# Patient Record
Sex: Female | Born: 1965 | Race: Black or African American | Hispanic: No | Marital: Married | Smoking: Never smoker
Health system: Southern US, Community
[De-identification: ages and names within clinical notes are randomized; demographics above are authoritative.]

## PROBLEM LIST (undated history)

## (undated) DIAGNOSIS — IMO0002 Reserved for concepts with insufficient information to code with codable children: Secondary | ICD-10-CM

## (undated) DIAGNOSIS — N840 Polyp of corpus uteri: Secondary | ICD-10-CM

## (undated) DIAGNOSIS — I10 Essential (primary) hypertension: Secondary | ICD-10-CM

## (undated) DIAGNOSIS — N83201 Unspecified ovarian cyst, right side: Secondary | ICD-10-CM

## (undated) DIAGNOSIS — N72 Inflammatory disease of cervix uteri: Secondary | ICD-10-CM

## (undated) HISTORY — DX: Inflammatory disease of cervix uteri: N72

## (undated) HISTORY — DX: Polyp of corpus uteri: N84.0

## (undated) HISTORY — DX: Reserved for concepts with insufficient information to code with codable children: IMO0002

## (undated) HISTORY — DX: Essential (primary) hypertension: I10

## (undated) HISTORY — PX: HERNIA REPAIR: SHX51

## (undated) HISTORY — DX: Unspecified ovarian cyst, right side: N83.201

---

## 1998-04-09 ENCOUNTER — Ambulatory Visit (HOSPITAL_COMMUNITY): Admission: RE | Admit: 1998-04-09 | Discharge: 1998-04-09 | Payer: Self-pay | Admitting: Obstetrics & Gynecology

## 1998-04-14 ENCOUNTER — Encounter (HOSPITAL_COMMUNITY): Admission: RE | Admit: 1998-04-14 | Discharge: 1998-05-13 | Payer: Self-pay | Admitting: *Deleted

## 1998-05-10 ENCOUNTER — Inpatient Hospital Stay (HOSPITAL_COMMUNITY): Admission: AD | Admit: 1998-05-10 | Discharge: 1998-05-10 | Payer: Self-pay | Admitting: Obstetrics

## 1998-05-12 ENCOUNTER — Inpatient Hospital Stay (HOSPITAL_COMMUNITY): Admission: AD | Admit: 1998-05-12 | Discharge: 1998-05-16 | Payer: Self-pay | Admitting: *Deleted

## 1998-05-15 ENCOUNTER — Encounter (HOSPITAL_COMMUNITY): Admission: RE | Admit: 1998-05-15 | Discharge: 1998-07-01 | Payer: Self-pay | Admitting: *Deleted

## 1999-04-20 ENCOUNTER — Ambulatory Visit (HOSPITAL_COMMUNITY): Admission: RE | Admit: 1999-04-20 | Discharge: 1999-04-20 | Payer: Self-pay | Admitting: *Deleted

## 1999-11-02 ENCOUNTER — Inpatient Hospital Stay (HOSPITAL_COMMUNITY): Admission: AD | Admit: 1999-11-02 | Discharge: 1999-11-07 | Payer: Self-pay | Admitting: *Deleted

## 2000-02-07 ENCOUNTER — Other Ambulatory Visit: Admission: RE | Admit: 2000-02-07 | Discharge: 2000-02-07 | Payer: Self-pay | Admitting: Obstetrics and Gynecology

## 2001-02-13 ENCOUNTER — Other Ambulatory Visit: Admission: RE | Admit: 2001-02-13 | Discharge: 2001-02-13 | Payer: Self-pay | Admitting: Obstetrics and Gynecology

## 2002-02-14 ENCOUNTER — Other Ambulatory Visit: Admission: RE | Admit: 2002-02-14 | Discharge: 2002-02-14 | Payer: Self-pay | Admitting: Obstetrics and Gynecology

## 2003-02-24 ENCOUNTER — Other Ambulatory Visit: Admission: RE | Admit: 2003-02-24 | Discharge: 2003-02-24 | Payer: Self-pay | Admitting: Obstetrics and Gynecology

## 2003-08-19 ENCOUNTER — Other Ambulatory Visit: Admission: RE | Admit: 2003-08-19 | Discharge: 2003-08-19 | Payer: Self-pay | Admitting: Obstetrics and Gynecology

## 2003-08-19 DIAGNOSIS — R87619 Unspecified abnormal cytological findings in specimens from cervix uteri: Secondary | ICD-10-CM | POA: Insufficient documentation

## 2003-10-13 ENCOUNTER — Other Ambulatory Visit: Admission: RE | Admit: 2003-10-13 | Discharge: 2003-10-13 | Payer: Self-pay | Admitting: Obstetrics and Gynecology

## 2003-10-13 HISTORY — PX: COLPOSCOPY: SHX161

## 2004-05-06 ENCOUNTER — Other Ambulatory Visit: Admission: RE | Admit: 2004-05-06 | Discharge: 2004-05-06 | Payer: Self-pay | Admitting: Obstetrics and Gynecology

## 2004-07-13 ENCOUNTER — Ambulatory Visit (HOSPITAL_COMMUNITY): Admission: RE | Admit: 2004-07-13 | Discharge: 2004-07-13 | Payer: Self-pay | Admitting: Obstetrics and Gynecology

## 2004-07-16 ENCOUNTER — Ambulatory Visit (HOSPITAL_COMMUNITY): Admission: RE | Admit: 2004-07-16 | Discharge: 2004-07-16 | Payer: Self-pay | Admitting: Obstetrics and Gynecology

## 2005-04-16 ENCOUNTER — Ambulatory Visit (HOSPITAL_COMMUNITY): Admission: RE | Admit: 2005-04-16 | Discharge: 2005-04-16 | Payer: Self-pay | Admitting: Obstetrics and Gynecology

## 2005-07-19 ENCOUNTER — Other Ambulatory Visit: Admission: RE | Admit: 2005-07-19 | Discharge: 2005-07-19 | Payer: Self-pay | Admitting: Obstetrics and Gynecology

## 2006-02-09 ENCOUNTER — Inpatient Hospital Stay (HOSPITAL_COMMUNITY): Admission: AD | Admit: 2006-02-09 | Discharge: 2006-02-12 | Payer: Self-pay | Admitting: Obstetrics and Gynecology

## 2006-02-19 ENCOUNTER — Inpatient Hospital Stay (HOSPITAL_COMMUNITY): Admission: AD | Admit: 2006-02-19 | Discharge: 2006-02-19 | Payer: Self-pay | Admitting: Obstetrics and Gynecology

## 2012-06-14 ENCOUNTER — Other Ambulatory Visit: Payer: Self-pay | Admitting: Family Medicine

## 2012-06-14 ENCOUNTER — Ambulatory Visit
Admission: RE | Admit: 2012-06-14 | Discharge: 2012-06-14 | Disposition: A | Discharge status: Home / Self Care | Payer: Commercial Managed Care - PPO | Source: Ambulatory Visit | Attending: Family Medicine | Admitting: Family Medicine

## 2012-06-14 DIAGNOSIS — M419 Scoliosis, unspecified: Secondary | ICD-10-CM

## 2012-07-17 ENCOUNTER — Other Ambulatory Visit: Payer: Self-pay | Admitting: Family Medicine

## 2012-07-17 ENCOUNTER — Ambulatory Visit
Admission: RE | Admit: 2012-07-17 | Discharge: 2012-07-17 | Disposition: A | Discharge status: Home / Self Care | Payer: Commercial Managed Care - PPO | Source: Ambulatory Visit | Attending: Family Medicine | Admitting: Family Medicine

## 2012-07-17 DIAGNOSIS — M439 Deforming dorsopathy, unspecified: Secondary | ICD-10-CM

## 2012-07-18 ENCOUNTER — Ambulatory Visit: Payer: Self-pay | Admitting: Obstetrics and Gynecology

## 2012-08-28 ENCOUNTER — Ambulatory Visit: Payer: Self-pay | Admitting: Obstetrics and Gynecology

## 2012-09-06 ENCOUNTER — Encounter: Payer: Self-pay | Admitting: Obstetrics and Gynecology

## 2012-09-06 ENCOUNTER — Ambulatory Visit: Payer: Commercial Managed Care - PPO | Admitting: Obstetrics and Gynecology

## 2012-09-06 VITALS — BP 110/72 | HR 72 | Ht 65.0 in | Wt 152.0 lb

## 2012-09-06 DIAGNOSIS — Z124 Encounter for screening for malignant neoplasm of cervix: Secondary | ICD-10-CM

## 2012-09-06 NOTE — Progress Notes (Signed)
Last Pap: 05/13/10 WNL: Yes Regular Periods:no Contraception: none  Monthly Breast exam:yes Tetanus<65yrs:yes Nl.Bladder Function:yes Daily BMs:yes Healthy Diet:yes Calcium:no Mammogram:no Date of Mammogram: n/a Exercise:no Have often Exercise: n/a Seatbelt: yes Abuse at home: no Stressful work:no  Sigmoid-colonoscopy: n/a Bone Density: No PCP: Dr. Loleta Chance Change in PMH: no changes Change in [facility]: no changes BP 110/72  Pulse 72  Ht 5\' 5"  (1.651 m)  Wt 152 lb (68.947 kg)  BMI 25.29 kg/m2  LMP 08/27/2012 Pt with complaints:yes and pt has a right inguinal hernia that is bothering her.   Physical Examination: General appearance - alert, well appearing, and in no distress Mental status - normal mood, behavior, speech, dress, motor activity, and thought processes Neck - supple, no significant adenopathy,  thyroid exam: thyroid is normal in size without nodules or tenderness Chest - clear to auscultation, no wheezes, rales or rhonchi, symmetric air entry Heart - normal rate and regular rhythm Abdomen - soft, nontender, nondistended, no masses or organomegaly Breasts - breasts appear normal, no suspicious masses, no skin or nipple changes or axillary nodes Pelvic - normal external genitalia, vulva, vagina, cervix, uterus and adnexa Rectal - rectal exam not indicated Back exam - full range of motion, no tenderness, palpable spasm or pain on motion Neurological - alert, oriented, normal speech, no focal findings or movement disorder noted Musculoskeletal - no joint tenderness, deformity or swelling Extremities - no edema, redness or tenderness in the calves or thighs Skin - normal coloration and turgor, no rashes, no suspicious skin lesions noted Routine exam Pap sent yes Mammogram due yes pt declined nothing used for contraception RT 1 month for [facility] pt with h/o endometrial polyp and desires OBS Refer to general surgery for inguinal hernia

## 2012-09-18 ENCOUNTER — Encounter (INDEPENDENT_AMBULATORY_CARE_PROVIDER_SITE_OTHER): Payer: Self-pay | Admitting: General Surgery

## 2012-09-18 ENCOUNTER — Ambulatory Visit (INDEPENDENT_AMBULATORY_CARE_PROVIDER_SITE_OTHER): Payer: Commercial Managed Care - PPO | Admitting: General Surgery

## 2012-09-18 VITALS — BP 118/76 | HR 80 | Temp 97.4°F | Resp 16 | Ht 66.0 in | Wt 150.6 lb

## 2012-09-18 DIAGNOSIS — R1031 Right lower quadrant pain: Secondary | ICD-10-CM

## 2012-09-18 NOTE — Patient Instructions (Signed)
Call when you notice it bulging

## 2012-09-18 NOTE — Progress Notes (Signed)
Subjective:     Patient ID: [name], female   DOB: [birth year], 47 y.o.   MRN: [id number]  HPI We are asked to see the patient in consultation by Dr. Normand Sloop to evaluate her for a right inguinal hernia. The patient is a 47 year old black female who has noticed for several years some intermittent swelling in the right groin. The swelling will come and go. Occasionally she will have some discomfort associated with the swelling. She denies any nausea or vomiting. Her bowels move regularly.  Review of Systems  Constitutional: Negative.   HENT: Negative.   Eyes: Negative.   Respiratory: Negative.   Cardiovascular: Negative.   Gastrointestinal: Negative.   Genitourinary: Negative.   Musculoskeletal: Negative.   Skin: Negative.   Neurological: Negative.   Hematological: Negative.   Psychiatric/Behavioral: Negative.        Objective:   Physical Exam  Constitutional: She is oriented to person, place, and time. She appears well-developed and well-nourished.  HENT:  Head: Normocephalic and atraumatic.  Eyes: Conjunctivae normal and EOM are normal. Pupils are equal, round, and reactive to light.  Neck: Normal range of motion. Neck supple.  Cardiovascular: Normal rate, regular rhythm and normal heart sounds.   Pulmonary/Chest: Effort normal and breath sounds normal.  Abdominal: Soft. Bowel sounds are normal.  Genitourinary:       There is no palpable fullness or impulse with straining in either groin today  Musculoskeletal: Normal range of motion.  Neurological: She is alert and oriented to person, place, and time.  Skin: Skin is warm and dry.  Psychiatric: She has a normal mood and affect. Her behavior is normal.       Assessment:     The patient has some intermittent right groin discomfort. This certainly could be from a small inguinal hernia that I just can't appreciate today on physical exam. Because we cannot appreciated on physical exam I have recommended either calling  us when the hernia is out so that we can see it or we could try to get a CT of her pelvis to look for radiographic evidence of a hernia.    Plan:     She has elected to call us when the hernia is out so that we can reexamine her. We will await to hear from her

## 2012-10-08 ENCOUNTER — Other Ambulatory Visit: Payer: Self-pay

## 2012-10-08 DIAGNOSIS — N840 Polyp of corpus uteri: Secondary | ICD-10-CM

## 2012-10-11 ENCOUNTER — Ambulatory Visit: Payer: Commercial Managed Care - PPO

## 2012-10-11 ENCOUNTER — Ambulatory Visit: Payer: Commercial Managed Care - PPO | Admitting: Obstetrics and Gynecology

## 2012-10-11 ENCOUNTER — Other Ambulatory Visit: Payer: Self-pay | Admitting: Obstetrics and Gynecology

## 2012-10-11 DIAGNOSIS — N840 Polyp of corpus uteri: Secondary | ICD-10-CM

## 2012-10-11 NOTE — Progress Notes (Signed)
Pt with h/o endometrial polyp SHG done per protocol Normal [place] No polyps seen  Rt 1 yr for AEX Pt denies any abnormal VB

## 2013-12-02 ENCOUNTER — Encounter (INDEPENDENT_AMBULATORY_CARE_PROVIDER_SITE_OTHER): Payer: Commercial Managed Care - PPO | Admitting: General Surgery

## 2013-12-24 ENCOUNTER — Encounter (INDEPENDENT_AMBULATORY_CARE_PROVIDER_SITE_OTHER): Payer: Self-pay | Admitting: General Surgery

## 2013-12-24 ENCOUNTER — Telehealth (INDEPENDENT_AMBULATORY_CARE_PROVIDER_SITE_OTHER): Payer: Self-pay

## 2013-12-24 ENCOUNTER — Ambulatory Visit (INDEPENDENT_AMBULATORY_CARE_PROVIDER_SITE_OTHER): Payer: Commercial Managed Care - PPO | Admitting: General Surgery

## 2013-12-24 VITALS — BP 130/76 | HR 100 | Temp 97.1°F | Ht 66.0 in | Wt 156.0 lb

## 2013-12-24 DIAGNOSIS — R1031 Right lower quadrant pain: Secondary | ICD-10-CM

## 2013-12-24 NOTE — Progress Notes (Signed)
Patient ID: [name], female   DOB: [birth year], 48 y.o.   MRN: [id number]  Chief Complaint  Patient presents with  . Routine Post Op    HPI Angelica HalimFrancisca C Ryer is a 48 y.o. female.  The patient is a 48 year old black female resolve over a year ago with right groin pain. She continues to have intermittent right groin pain. She denies any nausea or vomiting with it. Her appetite has been good and her bowels are working normally. She experiences the pain approximately every 2 weeks and it comes and goes very quickly. She does notice some swelling in her right groin at times.  HPI  Past Medical History  Diagnosis Date  . Hypertension   . Endometrial polyp   . Ovarian cyst, right   . Cervicitis   . Abnormal pap     Past Surgical History  Procedure Laterality Date  . Cesarean section  05/12/1998  . Cesarean section  11/04/1999  . Cesarean section  02/09/2006  . Colposcopy  10/13/03    Family History  Problem Relation Age of Onset  . Congestive Heart Failure Mother   . Breast cancer Sister   . Colon cancer Paternal Aunt     Social History History  Substance Use Topics  . Smoking status: Never Smoker   . Smokeless tobacco: Never Used  . Alcohol Use: No    No Known Allergies  Current Outpatient Prescriptions  Medication Sig Dispense Refill  . losartan (COZAAR) 25 MG tablet Take 25 mg by mouth daily.       No current facility-administered medications for this visit.    Review of Systems Review of Systems  Constitutional: Negative.   HENT: Negative.   Eyes: Negative.   Respiratory: Negative.   Cardiovascular: Negative.   Gastrointestinal: Negative.   Endocrine: Negative.   Genitourinary: Negative.   Musculoskeletal: Negative.   Skin: Negative.   Allergic/Immunologic: Negative.   Neurological: Negative.   Hematological: Negative.   Psychiatric/Behavioral: Negative.     Blood pressure 130/76, pulse 100, temperature 97.1 F (36.2 C), height 5\' 6"   (1.676 m), weight 156 lb (70.761 kg).  Physical Exam Physical Exam  Constitutional: She is oriented to person, place, and time. She appears well-developed and well-nourished.  HENT:  Head: Normocephalic and atraumatic.  Eyes: Conjunctivae and EOM are normal. Pupils are equal, round, and reactive to light.  Neck: Normal range of motion. Neck supple.  Cardiovascular: Normal rate, regular rhythm and normal heart sounds.   Pulmonary/Chest: Effort normal and breath sounds normal.  Abdominal: Soft. Bowel sounds are normal.  There does appear to be some fatty fullness that is palpable in the right groin but it does not reduce like a typical hernia. It is very soft. There is no abdominal pain.  Musculoskeletal: Normal range of motion.  Lymphadenopathy:    She has no cervical adenopathy.  Neurological: She is alert and oriented to person, place, and time.  Skin: Skin is warm and dry.  Psychiatric: She has a normal mood and affect. Her behavior is normal.    Data Reviewed As above  Assessment    The patient is having some intermittent pain and swelling in the right groin. Clinically I cannot appreciate a inguinal or ventral hernia. Because of this I think it would be reasonable to get a CT scan of the pelvis to look for radiographic evidence of a hernia.     Plan    Plan for CT of the pelvis. I will call with  the results of the study and then we can proceed accordingly.        Caleen Essexaul S Toth III 12/24/2013, 10:15 AM

## 2013-12-24 NOTE — Patient Instructions (Signed)
Will call with results of CT 

## 2013-12-24 NOTE — Telephone Encounter (Signed)
Pt was seen in the office today with Dr Carolynne Edouardoth. Dr Carolynne Edouardoth ordered a CT scan and pt was calling requesting the contrast. [facility] and they informed her that they were unable to receive the contrast from them because our office had given this to her. She states that we had not given this to her at her visit today. Informed her that she would be able to come and pick this up at our office. Pt was unsatisfied that she had to return to our office and then she hung up abruptly.

## 2013-12-26 ENCOUNTER — Other Ambulatory Visit: Payer: Commercial Managed Care - PPO

## 2014-01-01 ENCOUNTER — Ambulatory Visit
Admission: RE | Admit: 2014-01-01 | Discharge: 2014-01-01 | Disposition: A | Discharge status: Home / Self Care | Payer: Commercial Managed Care - PPO | Source: Ambulatory Visit | Attending: General Surgery | Admitting: General Surgery

## 2014-01-01 DIAGNOSIS — R1031 Right lower quadrant pain: Secondary | ICD-10-CM

## 2014-01-15 ENCOUNTER — Telehealth (INDEPENDENT_AMBULATORY_CARE_PROVIDER_SITE_OTHER): Payer: Self-pay

## 2014-01-15 NOTE — Telephone Encounter (Signed)
Message copied by Brennan Bailey on Wed Jan 15, 2014  1:34 PM ------      Message from: Caleen Essex III      Created: Tue Jan 07, 2014 11:21 AM       She has some sort of cyst in groin. They do not think it is a hernia ------

## 2014-01-15 NOTE — Telephone Encounter (Signed)
LMOM> Ct show a groin cyst, not a hernia. Needs appt to discuss possible cyst excision if she would like it removed.

## 2014-01-28 ENCOUNTER — Encounter (INDEPENDENT_AMBULATORY_CARE_PROVIDER_SITE_OTHER): Payer: Self-pay | Admitting: General Surgery

## 2014-01-28 ENCOUNTER — Ambulatory Visit (INDEPENDENT_AMBULATORY_CARE_PROVIDER_SITE_OTHER): Payer: Commercial Managed Care - PPO | Admitting: General Surgery

## 2014-01-28 VITALS — BP 122/76 | HR 88 | Ht 66.0 in | Wt 155.0 lb

## 2014-01-28 DIAGNOSIS — R1031 Right lower quadrant pain: Secondary | ICD-10-CM

## 2014-01-28 NOTE — Patient Instructions (Signed)
Call if you want to have cyst removed

## 2014-01-28 NOTE — Progress Notes (Signed)
Subjective:     Patient ID: [name], female   DOB: 1966/02/10, 48 y.o.   MRN: [id number]  HPI The patient is a 48 year old black female whom we have seen in the past with some intermittent right groin pain. Since her last visit we had her undergo a CT scan of her pelvis which showed a cyst in the right groin but the radiologist did not definitely think it was a hernia. Since the scan and her swelling has gone down and she denies any discomfort in her right groin.  Review of Systems  Constitutional: Negative.   HENT: Negative.   Eyes: Negative.   Respiratory: Negative.   Cardiovascular: Negative.   Gastrointestinal: Negative.   Endocrine: Negative.   Genitourinary: Negative.   Musculoskeletal: Negative.   Skin: Negative.   Allergic/Immunologic: Negative.   Neurological: Negative.   Hematological: Negative.   Psychiatric/Behavioral: Negative.        Objective:   Physical Exam  Constitutional: She is oriented to person, place, and time. She appears well-developed and well-nourished.  HENT:  Head: Normocephalic and atraumatic.  Eyes: Conjunctivae and EOM are normal. Pupils are equal, round, and reactive to light.  Neck: Normal range of motion. Neck supple.  Cardiovascular: Normal rate, regular rhythm and normal heart sounds.   Pulmonary/Chest: Effort normal and breath sounds normal.  Abdominal: Soft. Bowel sounds are normal.  Musculoskeletal: Normal range of motion.  Lymphadenopathy:    She has no cervical adenopathy.  Neurological: She is alert and oriented to person, place, and time.  Skin: Skin is warm and dry.  Psychiatric: She has a normal mood and affect. Her behavior is normal.       Assessment:     The patient appears to have a fluid filled cyst in the right groin by CT scan. I have offered to explore her right groin and remove the cyst. She is not sure that she wants to have surgery at this point and currently seems to be asymptomatic.     Plan:     I  have talked to her about the risks and benefits of surgery and she agrees to call us if she changes her mind and would like to pursue removing the cyst from the right groin

## 2014-06-16 ENCOUNTER — Encounter (INDEPENDENT_AMBULATORY_CARE_PROVIDER_SITE_OTHER): Payer: Self-pay | Admitting: General Surgery

## 2015-08-27 DIAGNOSIS — Z01419 Encounter for gynecological examination (general) (routine) without abnormal findings: Secondary | ICD-10-CM | POA: Diagnosis not present

## 2015-08-27 DIAGNOSIS — Z1231 Encounter for screening mammogram for malignant neoplasm of breast: Secondary | ICD-10-CM | POA: Diagnosis not present

## 2015-08-29 DIAGNOSIS — I10 Essential (primary) hypertension: Secondary | ICD-10-CM | POA: Diagnosis not present

## 2015-08-29 DIAGNOSIS — Z6825 Body mass index (BMI) 25.0-25.9, adult: Secondary | ICD-10-CM | POA: Diagnosis not present

## 2015-10-06 MED FILL — LOSARTAN POTASSIUM 100 MG T: 100 | 90 days supply | Qty: 90 | Fill #0

## 2015-11-26 DIAGNOSIS — I10 Essential (primary) hypertension: Secondary | ICD-10-CM | POA: Diagnosis not present

## 2015-11-30 DIAGNOSIS — I10 Essential (primary) hypertension: Secondary | ICD-10-CM | POA: Diagnosis not present

## 2016-01-08 MED FILL — LOSARTAN POTASSIUM 100 MG T: 100 | 90 days supply | Qty: 90 | Fill #1

## 2016-03-29 DIAGNOSIS — I10 Essential (primary) hypertension: Secondary | ICD-10-CM | POA: Diagnosis not present

## 2016-03-29 DIAGNOSIS — Z0000 Encounter for general adult medical examination without abnormal findings: Secondary | ICD-10-CM | POA: Diagnosis not present

## 2016-03-29 DIAGNOSIS — Z6825 Body mass index (BMI) 25.0-25.9, adult: Secondary | ICD-10-CM | POA: Diagnosis not present

## 2016-04-21 MED FILL — LOSARTAN POTASSIUM 100 MG T: 100 | 90 days supply | Qty: 90 | Fill #0

## 2016-08-01 ENCOUNTER — Ambulatory Visit: Payer: Self-pay | Admitting: Surgery

## 2016-08-01 DIAGNOSIS — Z01818 Encounter for other preprocedural examination: Secondary | ICD-10-CM | POA: Diagnosis not present

## 2016-08-01 DIAGNOSIS — R1909 Other intra-abdominal and pelvic swelling, mass and lump: Secondary | ICD-10-CM | POA: Diagnosis not present

## 2016-08-01 DIAGNOSIS — K4090 Unilateral inguinal hernia, without obstruction or gangrene, not specified as recurrent: Secondary | ICD-10-CM | POA: Diagnosis not present

## 2016-08-01 MED FILL — NAPROXEN 500 MG TABLET: 500 | 15 days supply | Qty: 30 | Fill #0

## 2016-08-01 NOTE — H&P (Signed)
[name] 08/01/2016 4:23 PM Location: [facility] Patient #: [id number] DOB: [birth year] Married / Language: English / Race: Black or African American Female  History of Present Illness [name]; 08/01/2016 5:34 PM) The patient is a 50 year old female who presents with inguinal swelling. Note for "Inguinal swelling": Patient returns for reevaluation of right groin swelling.  50 year old female. Was sent to our group in early 2014 for concern of right groin pain and possible hernia. No obvious hernia noted. Recommended follow-up if she gets recurrent symptoms or CT scan. Was not particularly bothersome so she held off. Then she had recurrent symptoms next year. CT scan showed a cystic mass in right groin. Not really consistent with a lymph node or a lipoma. Not definitely connected to the peritoneum. Not strongly suggested of hernia. Seems simple. Groin exploration with probable removal offered. She held off. She wished to be evaluated again. She cannot wait a month to see Dr. [name], therefore comes into my office for sooner evaluation.  Patient is a Engineer, civil (consulting). She notes if she is standing or walking for long periods of time, the right groin starts to bother her. She started to feel a lump now. No problems with urination or defecation. She can walk a half hour easily but does get worsening soreness after about 10-15 minutes. Moving her bowels every day. The pain is Much more intense over the last couple weeks. The last week was "ridiculously bad". However, Tylenol a couple times a day Did at base of that she could work. She is worried that things are getting worse. She is due to have a break for the next few weeks and wonders if she needs surgery could we do it now.   Past Surgical History Juanita Craver, CMA; 08/01/2016 4:23 PM) Cesarean Section - Multiple  Diagnostic Studies History Juanita Craver, CMA; 08/01/2016 4:23 PM) Colonoscopy  never Mammogram 1-3 years ago  Allergies Juanita Craver, CMA; 08/01/2016 4:24 PM) No Known Drug Allergies 08/01/2016  Medication History Juanita Craver, CMA; 08/01/2016 4:25 PM) Cozaar (100MG  Tablet, Oral) Active. Medications Reconciled  Social History Juanita Craver, CMA; 08/01/2016 4:23 PM) No alcohol use No caffeine use No drug use Tobacco use Never smoker.  Family History Juanita Craver, CMA; 08/01/2016 4:23 PM) Breast Cancer Sister. Diabetes Mellitus Son. Heart Disease Mother. Heart disease in female family member before age 33 Hypertension Mother.  Pregnancy / Birth History Juanita Craver, CMA; 08/01/2016 4:23 PM) Age at menarche 16 years. Age of menopause 55-50 Gravida 4 Length (months) of breastfeeding 3-6 Maternal age 37-30 Para 3  Other Problems Juanita Craver, CMA; 08/01/2016 4:23 PM) High blood pressure Inguinal Hernia     Review of Systems (Armen Glenn CMA; 08/01/2016 4:23 PM) General Not Present- Appetite Loss, Chills, Fatigue, Fever, Night Sweats, Weight Gain and Weight Loss. Skin Not Present- Change in Wart/Mole, Dryness, Hives, Jaundice, New Lesions, Non-Healing Wounds, Rash and Ulcer. HEENT Not Present- Earache, Hearing Loss, Hoarseness, Nose Bleed, Oral Ulcers, Ringing in the Ears, Seasonal Allergies, Sinus Pain, Sore Throat, Visual Disturbances, Wears glasses/contact lenses and Yellow Eyes. Breast Not Present- Breast Mass, Breast Pain, Nipple Discharge and Skin Changes. Cardiovascular Not Present- Chest Pain, Difficulty Breathing Lying Down, Leg Cramps, Palpitations, Rapid Heart Rate, Shortness of Breath and Swelling of Extremities. Gastrointestinal Not Present- Abdominal Pain, Bloating, Bloody Stool, Change in Bowel Habits, Chronic diarrhea, Constipation, Difficulty Swallowing, Excessive gas, Gets full quickly at meals, Hemorrhoids, Indigestion, Nausea, Rectal Pain and Vomiting. Female Genitourinary Not Present- Frequency, Nocturia,  Painful  Urination, Pelvic Pain and Urgency. Musculoskeletal Not Present- Back Pain, Joint Pain, Joint Stiffness, Muscle Pain, Muscle Weakness and Swelling of Extremities. Neurological Not Present- Decreased Memory, Fainting, Headaches, Numbness, Seizures, Tingling, Tremor, Trouble walking and Weakness. Psychiatric Not Present- Anxiety, Bipolar, Change in Sleep Pattern, Depression, Fearful and Frequent crying. Endocrine Not Present- Cold Intolerance, Excessive Hunger, Hair Changes, Heat Intolerance, Hot flashes and New Diabetes. Hematology Not Present- Blood Thinners, Easy Bruising, Excessive bleeding, Gland problems, HIV and Persistent Infections.  Vitals (Armen Glenn CMA; 08/01/2016 4:24 PM) 08/01/2016 4:24 PM Weight: 156.38 lb Height: 66in Body Surface Area: 1.8 m Body Mass Index: 25.24 kg/m  Temp.: 98.15F  Pulse: 97 (Regular)  P.OX: 99% (Room air) BP: 138/84 (Sitting, Left Arm, Standard)      Physical Exam [name](Marillyn Goren C. Vielka Klinedinst MD; 08/01/2016 4:50 PM)  General Mental Status-Alert. General Appearance-Not in acute distress, Not Sickly. Orientation-Oriented X3. Hydration-Well hydrated. Voice-Normal.  Integumentary Global Assessment Upon inspection and palpation of skin surfaces of the - Axillae: non-tender, no inflammation or ulceration, no drainage. and Distribution of scalp and body hair is normal. General Characteristics Temperature - normal warmth is noted.  Head and Neck Head-normocephalic, atraumatic with no lesions or palpable masses. Face Global Assessment - atraumatic, no absence of expression. Neck Global Assessment - no abnormal movements, no bruit auscultated on the right, no bruit auscultated on the left, no decreased range of motion, non-tender. Trachea-midline. Thyroid Gland Characteristics - non-tender.  Eye Eyeball - Left-Extraocular movements intact, No Nystagmus. Eyeball - Right-Extraocular movements intact, No Nystagmus. Cornea -  Left-No Hazy. Cornea - Right-No Hazy. Sclera/Conjunctiva - Left-No scleral icterus, No Discharge. Sclera/Conjunctiva - Right-No scleral icterus, No Discharge. Pupil - Left-Direct reaction to light normal. Pupil - Right-Direct reaction to light normal. Note: Wears glasses. Vision corrected  ENMT Ears Pinna - Left - no drainage observed, no generalized tenderness observed. Right - no drainage observed, no generalized tenderness observed. Nose and Sinuses External Inspection of the Nose - no destructive lesion observed. Inspection of the nares - Left - quiet respiration. Right - quiet respiration. Mouth and Throat Lips - Upper Lip - no fissures observed, no pallor noted. Lower Lip - no fissures observed, no pallor noted. Nasopharynx - no discharge present. Oral Cavity/Oropharynx - Tongue - no dryness observed. Oral Mucosa - no cyanosis observed. Hypopharynx - no evidence of airway distress observed.  Chest and Lung Exam Inspection Movements - Normal and Symmetrical. Accessory muscles - No use of accessory muscles in breathing. Palpation Palpation of the chest reveals - Non-tender. Auscultation Breath sounds - Normal and Clear.  Cardiovascular Auscultation Rhythm - Regular. Murmurs & Other Heart Sounds - Auscultation of the heart reveals - No Murmurs and No Systolic Clicks.  Abdomen Inspection Inspection of the abdomen reveals - No Visible peristalsis and No Abnormal pulsations. Umbilicus - No Bleeding, No Urine drainage. Palpation/Percussion Palpation and Percussion of the abdomen reveal - Soft, Non Tender, No Rebound tenderness, No Rigidity (guarding) and No Cutaneous hyperesthesia. Note: Abdomen soft. Nontender, nondistended. No guarding. No diastasis. No umbilical nor other hernias  Female Genitourinary Sexual Maturity Tanner 5 - Adult hair pattern. Note: Obvious mass in right groin. I do feel an impulse with standing and coughing. Not fully collapsible but  mobile. Not fixed. Seems more suggestive of a hernia to me.   No pain or symptoms or anything obvious on the left side. No pain in the inner thigh/femoral canal. No obvious femoral hernias there. No left inguinal hernia. No inguinal lymphadenopathy. No  major vaginal bleeding nor foul discharge No vaginal bleeding nor discharge  Peripheral Vascular Upper Extremity Inspection - Left - No Cyanotic nailbeds, Not Ischemic. Right - No Cyanotic nailbeds, Not Ischemic.  Neurologic Neurologic evaluation reveals -normal attention span and ability to concentrate, able to name objects and repeat phrases. Appropriate fund of knowledge , normal sensation and normal coordination. Mental Status Affect - not angry, not paranoid. Cranial Nerves-Normal Bilaterally. Gait-Normal.  Neuropsychiatric Mental status exam performed with findings of-able to articulate well with normal speech/language, rate, volume and coherence, thought content normal with ability to perform basic computations and apply abstract reasoning and no evidence of hallucinations, delusions, obsessions or homicidal/suicidal ideation.  Musculoskeletal Global Assessment Spine, Ribs and Pelvis - no instability, subluxation or laxity. Right Upper Extremity - no instability, subluxation or laxity.  Lymphatic Head & Neck  General Head & Neck Lymphatics: Bilateral - Description - No Localized lymphadenopathy. Axillary  General Axillary Region: Bilateral - Description - No Localized lymphadenopathy. Femoral & Inguinal  Generalized Femoral & Inguinal Lymphatics: Left - Description - No Localized lymphadenopathy. Right - Description - No Localized lymphadenopathy.    Assessment & Plan [name](Orlandria Kissner C. Jidenna Figgs MD; 08/01/2016 4:55 PM)  RIGHT INGUINAL HERNIA (K40.90) Impression: Right groin mass change with Valsalva or suspicious for inguinal hernia. I suppose she could have a female hydrocele through the canal of Nuck.  I think she would  benefit from laparoscopic exploration to see if she has any hernias and if so repair them with mesh in a preperitoneal Like fashion.  As completely negative, then off recurrent exploration to remove this cystic mass in the right groin. She is interested in proceeding.  The pain seems to be managed with nonsteroidals. Offer a stronger dose to help her function.  Of course, the pain is more severe and she's hopning and done over the winter break. We are overbooked, but we will try and fit her in soon  Current Plans Started Naproxen 500MG , 1 (one) Tablet two times daily, as needed, #30, 08/01/2016, Ref. x1. RIGHT GROIN MASS (R19.09) Impression: Suspect it may be more female hydrocele or benign cystic mass. Not consistent with a lymph node, lipoma, endometrioma. We'll remove the mass at the time of surgery. This may require a groin exploration through a more open incision if this is just mass cannot be reduced from the intraperitoneal/preperitoneal aspect. Hopefully likely we can do at that way though and voided extra incision. Regardless, the mass needs to go. Marland Kitchen.  PREOP - ING HERNIA - ENCOUNTER FOR PREOPERATIVE EXAMINATION FOR GENERAL SURGICAL PROCEDURE (Z01.818)  Current Plans You are being scheduled for surgery- Our schedulers will call you.  You should hear from our office's scheduling department within 5 working days about the location, date, and time of surgery. We try to make accommodations for patient's preferences in scheduling surgery, but sometimes the OR schedule or the surgeon's schedule prevents us from making those accommodations.  If you have not heard from our office [phone]) in 5 working days, call the office and ask for your surgeon's nurse.  If you have other questions about your diagnosis, plan, or surgery, call the office and ask for your surgeon's nurse.  Written instructions provided The anatomy & physiology of the abdominal wall and pelvic floor was discussed.  The pathophysiology of hernias in the inguinal and pelvic region was discussed. Natural history risks such as progressive enlargement, pain, incarceration, and strangulation was discussed. Contributors to complications such as smoking, obesity, diabetes, prior surgery, etc were  discussed.  I feel the risks of no intervention will lead to serious problems that outweigh the operative risks; therefore, I recommended surgery to reduce and repair the hernia. I explained laparoscopic techniques with possible need for an open approach. I noted usual use of mesh to patch and/or buttress hernia repair  Risks such as bleeding, infection, abscess, need for further treatment, heart attack, death, and other risks were discussed. I noted a good likelihood this will help address the problem. Goals of post-operative recovery were discussed as well. Possibility that this will not correct all symptoms was explained. I stressed the importance of low-impact activity, aggressive pain control, avoiding constipation, & not pushing through pain to minimize risk of post-operative chronic pain or injury. Possibility of reherniation was discussed. We will work to minimize complications.  An educational handout further explaining the pathology & treatment options was given as well. Questions were answered. The patient expresses understanding & wishes to proceed with surgery.  Pt Education - Pamphlet Given - Laparoscopic Hernia Repair: discussed with patient and provided information. Pt Education - [facility] Pain Control (Ritta Hammes) Pt Education - [facility] Hernia Post-Op HCI (Keeghan Mcintire): discussed with patient and provided information.  [name], M.D., F.A.C.S. Gastrointestinal and Minimally Invasive Surgery [facility] [address], [address] [place], [place] [address] [phone] Main / Paging

## 2016-08-02 MED FILL — LOSARTAN POTASSIUM 100 MG T: 100 | 90 days supply | Qty: 90 | Fill #1

## 2016-09-27 DIAGNOSIS — I10 Essential (primary) hypertension: Secondary | ICD-10-CM | POA: Diagnosis not present

## 2016-09-27 MED FILL — AMLODIPINE BESYLATE 2.5 MG: 2.5 | 90 days supply | Qty: 90 | Fill #0

## 2016-10-04 DIAGNOSIS — I10 Essential (primary) hypertension: Secondary | ICD-10-CM | POA: Diagnosis not present

## 2016-11-09 MED FILL — LOSARTAN POTASSIUM 100 MG T: 100 | 90 days supply | Qty: 90 | Fill #2

## 2016-12-27 DIAGNOSIS — I10 Essential (primary) hypertension: Secondary | ICD-10-CM | POA: Diagnosis not present

## 2016-12-27 MED FILL — AMLODIPINE BESYLATE 5 MG TA: 5 | 90 days supply | Qty: 90 | Fill #0

## 2017-01-17 MED FILL — NAPROXEN 500 MG TABLET: 500 | 15 days supply | Qty: 30 | Fill #1

## 2017-02-13 MED FILL — LOSARTAN POTASSIUM 100 MG T: 100 | 90 days supply | Qty: 90 | Fill #3

## 2017-03-27 DIAGNOSIS — I10 Essential (primary) hypertension: Secondary | ICD-10-CM | POA: Diagnosis not present

## 2017-03-27 MED FILL — AMLODIPINE BESYLATE 5 MG TA: 5 | 90 days supply | Qty: 90 | Fill #0

## 2017-05-09 ENCOUNTER — Other Ambulatory Visit: Payer: Self-pay | Admitting: Family Medicine

## 2017-05-09 ENCOUNTER — Ambulatory Visit
Admission: RE | Admit: 2017-05-09 | Discharge: 2017-05-09 | Disposition: A | Discharge status: Home / Self Care | Payer: 59 | Source: Ambulatory Visit | Attending: Family Medicine | Admitting: Family Medicine

## 2017-05-09 DIAGNOSIS — Z111 Encounter for screening for respiratory tuberculosis: Secondary | ICD-10-CM

## 2017-05-09 DIAGNOSIS — R7611 Nonspecific reaction to tuberculin skin test without active tuberculosis: Secondary | ICD-10-CM | POA: Diagnosis not present

## 2017-05-09 DIAGNOSIS — I10 Essential (primary) hypertension: Secondary | ICD-10-CM | POA: Diagnosis not present

## 2017-05-12 MED FILL — LOSARTAN POTASSIUM 100 MG T: 100 | 90 days supply | Qty: 90 | Fill #0

## 2017-06-28 MED FILL — AMLODIPINE BESYLATE 5 MG TA: 5 | 90 days supply | Qty: 90 | Fill #1

## 2017-08-18 MED FILL — LOSARTAN POTASSIUM 100 MG T: 100 | 90 days supply | Qty: 90 | Fill #1

## 2017-09-06 DIAGNOSIS — Z1231 Encounter for screening mammogram for malignant neoplasm of breast: Secondary | ICD-10-CM | POA: Diagnosis not present

## 2017-09-06 DIAGNOSIS — Z6826 Body mass index (BMI) 26.0-26.9, adult: Secondary | ICD-10-CM | POA: Diagnosis not present

## 2017-09-06 DIAGNOSIS — Z124 Encounter for screening for malignant neoplasm of cervix: Secondary | ICD-10-CM | POA: Diagnosis not present

## 2017-09-06 DIAGNOSIS — Z01419 Encounter for gynecological examination (general) (routine) without abnormal findings: Secondary | ICD-10-CM | POA: Diagnosis not present

## 2017-09-25 DIAGNOSIS — Z0000 Encounter for general adult medical examination without abnormal findings: Secondary | ICD-10-CM | POA: Diagnosis not present

## 2017-09-25 DIAGNOSIS — I10 Essential (primary) hypertension: Secondary | ICD-10-CM | POA: Diagnosis not present

## 2017-09-29 MED FILL — AMLODIPINE BESYLATE 5 MG TA: 5 | 90 days supply | Qty: 90 | Fill #1

## 2017-11-13 MED FILL — LOSARTAN POTASSIUM 100 MG T: 100 | 90 days supply | Qty: 90 | Fill #0

## 2017-12-25 MED FILL — AMLODIPINE BESYLATE 5 MG TA: 5 | 90 days supply | Qty: 90 | Fill #0

## 2017-12-26 DIAGNOSIS — I10 Essential (primary) hypertension: Secondary | ICD-10-CM | POA: Diagnosis not present

## 2017-12-26 DIAGNOSIS — M25562 Pain in left knee: Secondary | ICD-10-CM | POA: Diagnosis not present

## 2017-12-26 MED FILL — NAPROXEN 500 MG TABLET: 500 | 30 days supply | Qty: 60 | Fill #0

## 2018-02-16 MED FILL — LOSARTAN POTASSIUM 100 MG T: 100 | 90 days supply | Qty: 90 | Fill #1

## 2018-03-26 MED FILL — AMLODIPINE BESYLATE 5 MG TA: 5 | 90 days supply | Qty: 90 | Fill #1

## 2018-03-27 DIAGNOSIS — I10 Essential (primary) hypertension: Secondary | ICD-10-CM | POA: Diagnosis not present

## 2018-03-27 DIAGNOSIS — Z6826 Body mass index (BMI) 26.0-26.9, adult: Secondary | ICD-10-CM | POA: Diagnosis not present

## 2018-03-27 MED FILL — NAPROXEN 500 MG TABLET: 500 | 30 days supply | Qty: 60 | Fill #0

## 2018-05-17 MED FILL — LOSARTAN POTASSIUM 100 MG T: 100 | 90 days supply | Qty: 90 | Fill #0

## 2018-06-22 MED FILL — AMLODIPINE BESYLATE 5 MG TA: 5 | 90 days supply | Qty: 90 | Fill #0

## 2018-07-10 MED FILL — NAPROXEN 500 MG TABLET: 500 | 30 days supply | Qty: 60 | Fill #1

## 2018-08-16 MED FILL — LOSARTAN POTASSIUM 100 MG T: 100 | 90 days supply | Qty: 90 | Fill #1

## 2018-09-10 ENCOUNTER — Ambulatory Visit: Payer: Self-pay | Admitting: Surgery

## 2018-09-10 ENCOUNTER — Encounter (HOSPITAL_COMMUNITY): Payer: Self-pay | Admitting: *Deleted

## 2018-09-10 DIAGNOSIS — R1909 Other intra-abdominal and pelvic swelling, mass and lump: Secondary | ICD-10-CM | POA: Diagnosis not present

## 2018-09-10 DIAGNOSIS — K4090 Unilateral inguinal hernia, without obstruction or gangrene, not specified as recurrent: Secondary | ICD-10-CM | POA: Diagnosis not present

## 2018-09-10 NOTE — H&P (Signed)
[name] Documented: 09/10/2018 10:42 AM Location: [facility] Patient #: [phone] DOB: 22-May-1966 Married / Language: English / Race: Black or African American Female  History of Present Illness Angelica Sportsman MD; 09/10/2018 1:45 PM) The patient is a 53 year old female who presents with inguinal swelling. Note for "Inguinal swelling": ` ` ` Patient returns for reevaluation of right groin swelling.  Patient returns 3 years later with concerns of intermittent right groin pain and swelling. I offered hernia repair 3 years ago. He was not scheduled. She notes it seemed to calm down to get better. However she had a intensely painful episode a few weeks ago which scared her. She wish to reconsider surgery. She felt a lump. Eventually she was able to massage down and reduce it. She does note something definitely pops in and out on the right side. No symptoms on the left side  Prior Office visit: 53 year old female. Was sent to our group in early 2014 for concern of right groin pain and possible hernia. No obvious hernia noted. Recommended follow-up if she gets recurrent symptoms or CT scan. Was not particularly bothersome so she held off. Then she had recurrent symptoms next year. CT scan showed a cystic mass in right groin. Not really consistent with a lymph node or a lipoma. Not definitely connected to the peritoneum. Not strongly suggested of hernia. Seems simple. Groin exploration with probable removal offered. She held off. She wished to be evaluated again. She cannot wait a month to see Dr. Carolynne Scott, therefore comes into my office for sooner evaluation.  Patient is a Engineer, civil (consulting). She notes if she is standing or walking for long periods of time, the right groin starts to bother her. She started to feel a lump now. No problems with urination or defecation. She can walk a half hour easily but does get worsening soreness after about 10-15 minutes. Moving her  bowels every day. The pain is Much more intense over the last couple weeks. The last week was "ridiculously bad". However, Tylenol a couple times a day Did at base of that she could work. She is worried that things are getting worse. She is due to have a break for the next few weeks and wonders if she needs surgery could we do it now.   Allergies ([name], CMA; 09/10/2018 10:42 AM) No Known Allergies [09/10/2018]: No Known Drug Allergies [08/01/2016]: Allergies Reconciled  Medication History [name], CMA; 09/10/2018 10:42 AM) Naproxen (500MG  Tablet, 1 (one) Oral two times daily, as needed, Taken starting 08/01/2016) Active. Cozaar (100MG  Tablet, Oral) Active. amLODIPine Besylate (5MG  Tablet, Oral) Active. Medications Reconciled     Review of Systems Angelica Sportsman, MD; 09/10/2018 11:6 AM) General Not Present- Appetite Loss, Chills, Fatigue, Fever, Night Sweats, Weight Gain and Weight Loss. Skin Not Present- Change in Wart/Mole, Dryness, Hives, Jaundice, New Lesions, Non-Healing Wounds, Rash and Ulcer. HEENT Not Present- Earache, Hearing Loss, Hoarseness, Nose Bleed, Oral Ulcers, Ringing in the Ears, Seasonal Allergies, Sinus Pain, Sore Throat, Visual Disturbances, Wears glasses/contact lenses and Yellow Eyes. Breast Not Present- Breast Mass, Breast Pain, Nipple Discharge and Skin Changes. Cardiovascular Not Present- Chest Pain, Difficulty Breathing Lying Down, Leg Cramps, Palpitations, Rapid Heart Rate, Shortness of Breath and Swelling of Extremities. Gastrointestinal Not Present- Abdominal Pain, Bloating, Bloody Stool, Change in Bowel Habits, Chronic diarrhea, Constipation, Difficulty Swallowing, Excessive gas, Gets full quickly at meals, Hemorrhoids, Indigestion, Nausea, Rectal Pain and Vomiting. Female Genitourinary Not Present- Frequency, Nocturia, Painful Urination, Pelvic Pain  and Urgency. Musculoskeletal Not Present- Back Pain, Joint Pain, Joint Stiffness,  Muscle Pain, Muscle Weakness and Swelling of Extremities. Neurological Not Present- Decreased Memory, Fainting, Headaches, Numbness, Seizures, Tingling, Tremor, Trouble walking and Weakness. Psychiatric Not Present- Anxiety, Bipolar, Change in Sleep Pattern, Depression, Fearful and Frequent crying. Endocrine Not Present- Cold Intolerance, Excessive Hunger, Hair Changes, Heat Intolerance, Hot flashes and New Diabetes. Hematology Not Present- Blood Thinners, Easy Bruising, Excessive bleeding, Gland problems, HIV and Persistent Infections.  Vitals ([name] CMA; 09/10/2018 10:43 AM) 09/10/2018 10:42 AM Weight: 164.25 lb Height: 66in Body Surface Area: 1.84 m Body Mass Index: 26.51 kg/m  Temp.: 96.69F(Temporal)  Pulse: 83 (Regular)  P.OX: 98% (Room air) BP: 108/62 (Sitting, Right Arm, Standard)      Physical Exam [name](Angelica Costlow C. Edee Nifong MD; 09/10/2018 11:11 AM)  General Mental Status-Alert. General Appearance-Not in acute distress, Not Sickly. Orientation-Oriented X3. Hydration-Well hydrated. Voice-Normal.  Integumentary Global Assessment Normal Exam - Axillae: non-tender, no inflammation or ulceration, no drainage. and Distribution of scalp and body hair is normal. General Characteristics Temperature - normal warmth is noted.  Head and Neck Head-normocephalic, atraumatic with no lesions or palpable masses. Face Global Assessment - atraumatic, no absence of expression. Neck Global Assessment - no abnormal movements, no bruit auscultated on the right, no bruit auscultated on the left, no decreased range of motion, non-tender. Trachea-midline. Thyroid Gland Characteristics - non-tender.  Eye Eyeball - Left-Extraocular movements intact, No Nystagmus. Eyeball - Right-Extraocular movements intact, No Nystagmus. Cornea - Left-No Hazy. Cornea - Right-No Hazy. Sclera/Conjunctiva - Left-No scleral icterus, No Discharge. Sclera/Conjunctiva -  Right-No scleral icterus, No Discharge. Pupil - Left-Direct reaction to light normal. Pupil - Right-Direct reaction to light normal. Note: Wears glasses. Vision corrected  ENMT Ears Pinna - Left - no drainage observed, no generalized tenderness observed. Right - no drainage observed, no generalized tenderness observed. Nose and Sinuses Nose - no destructive lesion observed. Nares - Left - quiet respiration. Right - quiet respiration. Mouth and Throat Lips - Upper Lip - no fissures observed, no pallor noted. Lower Lip - no fissures observed, no pallor noted. Nasopharynx - no discharge present. Oral Cavity/Oropharynx - Tongue - no dryness observed. Oral Mucosa - no cyanosis observed. Hypopharynx - no evidence of airway distress observed.  Chest and Lung Exam Inspection Movements - Normal and Symmetrical. Accessory muscles - No use of accessory muscles in breathing. Palpation Normal exam - Non-tender. Auscultation Breath sounds - Normal and Clear.  Cardiovascular Auscultation Rhythm - Regular. Murmurs & Other Heart Sounds - Normal exam - No Murmurs and No Systolic Clicks.  Abdomen Inspection Normal Exam - No Visible peristalsis and No Abnormal pulsations. Umbilicus - No Bleeding, No Urine drainage. Palpation/Percussion Normal exam - Soft, Non Tender, No Rebound tenderness, No Rigidity (guarding) and No Cutaneous hyperesthesia. Note: Abdomen soft. Nontender, nondistended. No guarding. No diastasis. No umbilical nor other hernias  Female Genitourinary Sexual Maturity Tanner 5 - Adult hair pattern. Note: Right groin discomfort with some subtle bulging with Valsalva. Mildly increased. I do feel an impulse with standing and coughing. Not fully collapsible but mobile. Not fixed. Seems more suggestive of a hernia to me.    No pain or symptoms or anything obvious on the left side. No pain in the inner thigh/femoral canal. No obvious femoral hernias there. No left  inguinal hernia. No inguinal lymphadenopathy. No major vaginal bleeding nor foul discharge  Peripheral Vascular Upper Extremity Inspection - Left - No Cyanotic nailbeds, Not Ischemic. Right - No Cyanotic nailbeds,  Not Ischemic.  Neurologic Neurologic evaluation reveals -normal attention span and ability to concentrate, able to name objects and repeat phrases. Appropriate fund of knowledge , normal sensation and normal coordination. Mental Status Affect - not angry, not paranoid. Cranial Nerves-Normal Bilaterally. Gait-Normal.  Neuropsychiatric Mental status exam performed with findings of-able to articulate well with normal speech/language, rate, volume and coherence, thought content normal with ability to perform basic computations and apply abstract reasoning and no evidence of hallucinations, delusions, obsessions or homicidal/suicidal ideation.  Musculoskeletal Global Assessment Spine, Ribs and Pelvis - no instability, subluxation or laxity. Right Upper Extremity - no instability, subluxation or laxity.  Lymphatic Head & Neck General Head & Neck Lymphatics: Bilateral - Description - No Localized lymphadenopathy. Axillary General Axillary Region: Bilateral - Description - No Localized lymphadenopathy. Femoral & Inguinal Generalized Femoral & Inguinal Lymphatics: Left: Right - Description - No Localized lymphadenopathy. Description - No Localized lymphadenopathy.    Assessment & Plan (Vennie Salsbury C. Jeiry Birnbaum MD; 09/10/2018 11:10 AM)  RIGHT INGUINAL HERNIA (K40.90) Impression: Right groin mass change with Valsalva or suspicious for inguinal hernia. Larger and more symptomatic 3 years later. I suppose she could have a female hydrocele through the canal of Nuck, but I'm skeptical that with a changing with positioned that in is a specific solid mass.  Groin exploration and been offered in 2014. I had recommended laparoscopic exploration and hernia surgery in 2017. I again think she  would benefit from laparoscopic exploration to see if she has any hernias and if so repair them with mesh in a preperitoneal fashion.  As completely negative, then groin exploration to remove this cystic mass in the right groin. I am skeptical that that is what is going on since she has intermittent swelling that she can reduce. She is interested in proceeding.  I think she again is coming in the winter hoping we can do a right away. We will try and work a convenient time  Current Plans Pt Education - [facility] (Rosselyn Martha)  RIGHT GROIN MASS (R19.09) Impression: Suspect her lump is just hernia sac. Not consistent with a lymph node, lipoma, endometrioma. If there is persistent mass after reducing the hernia that cannot be explained by hernia sac, I will do groin exploration through a more open incision if this is just mass cannot be reduced from the intraperitoneal/preperitoneal aspect. Hopefully likely we can do at that way though and avoid an extra incision. Regardless, the mass needs to go.   PREOP - ING HERNIA - ENCOUNTER FOR PREOPERATIVE EXAMINATION FOR GENERAL SURGICAL PROCEDURE (Z01.818)  Current Plans You are being scheduled for surgery- Our schedulers will call you.  You should hear from our office's scheduling department within 5 working days about the location, date, and time of surgery. We try to make accommodations for patient's preferences in scheduling surgery, but sometimes the OR schedule or the surgeon's schedule prevents us from making those accommodations.  If you have not heard from our office ([phone]) in 5 working days, call the office and ask for your surgeon's nurse.  If you have other questions about your diagnosis, plan, or surgery, call the office and ask for your surgeon's nurse.  Written instructions provided The anatomy & physiology of the abdominal wall and pelvic floor was discussed. The pathophysiology of hernias in the inguinal and pelvic region  was discussed. Natural history risks such as progressive enlargement, pain, incarceration, and strangulation was discussed. Contributors to complications such as smoking, obesity, diabetes, prior surgery, etc were discussed.    I feel the risks of no intervention will lead to serious problems that outweigh the operative risks; therefore, I recommended surgery to reduce and repair the hernia. I explained laparoscopic techniques with possible need for an open approach. I noted usual use of mesh to patch and/or buttress hernia repair  Risks such as bleeding, infection, abscess, need for further treatment, heart attack, death, and other risks were discussed. I noted a good likelihood this will help address the problem. Goals of post-operative recovery were discussed as well. Possibility that this will not correct all symptoms was explained. I stressed the importance of low-impact activity, aggressive pain control, avoiding constipation, & not pushing through pain to minimize risk of post-operative chronic pain or injury. Possibility of reherniation was discussed. We will work to minimize complications.  An educational handout further explaining the pathology & treatment options was given as well. Questions were answered. The patient expresses understanding & wishes to proceed with surgery.  Pt Education - Pamphlet Given - Laparoscopic Hernia Repair: discussed with patient and provided information. Pt Education - [facility] Post-Op [facility]CI (Jancarlo Biermann): discussed with patient and provided information.  Angelica Sportsman, MD, FACS, MASCRS Gastrointestinal and Minimally Invasive Surgery    [address], [address] [place], [place] [address] [phone] Main / Paging [phone] Fax

## 2018-09-10 NOTE — H&P (View-Only) (Signed)
[name] Documented: 09/10/2018 10:42 AM Location: [facility] Patient #: [phone] DOB: 22-May-1966 Married / Language: English / Race: Black or African American Female  History of Present Illness Angelica Sportsman MD; 09/10/2018 1:45 PM) The patient is a 53 year old female who presents with inguinal swelling. Note for "Inguinal swelling": ` ` ` Patient returns for reevaluation of right groin swelling.  Patient returns 3 years later with concerns of intermittent right groin pain and swelling. I offered hernia repair 3 years ago. He was not scheduled. She notes it seemed to calm down to get better. However she had a intensely painful episode a few weeks ago which scared her. She wish to reconsider surgery. She felt a lump. Eventually she was able to massage down and reduce it. She does note something definitely pops in and out on the right side. No symptoms on the left side  Prior Office visit: 53 year old female. Was sent to our group in early 2014 for concern of right groin pain and possible hernia. No obvious hernia noted. Recommended follow-up if she gets recurrent symptoms or CT scan. Was not particularly bothersome so she held off. Then she had recurrent symptoms next year. CT scan showed a cystic mass in right groin. Not really consistent with a lymph node or a lipoma. Not definitely connected to the peritoneum. Not strongly suggested of hernia. Seems simple. Groin exploration with probable removal offered. She held off. She wished to be evaluated again. She cannot wait a month to see Dr. [name], therefore comes into my office for sooner evaluation.  Patient is a Engineer, civil (consulting). She notes if she is standing or walking for long periods of time, the right groin starts to bother her. She started to feel a lump now. No problems with urination or defecation. She can walk a half hour easily but does get worsening soreness after about 10-15 minutes. Moving her  bowels every day. The pain is Much more intense over the last couple weeks. The last week was "ridiculously bad". However, Tylenol a couple times a day Did at base of that she could work. She is worried that things are getting worse. She is due to have a break for the next few weeks and wonders if she needs surgery could we do it now.   Allergies ([name], CMA; 09/10/2018 10:42 AM) No Known Allergies [09/10/2018]: No Known Drug Allergies [08/01/2016]: Allergies Reconciled  Medication History [name], CMA; 09/10/2018 10:42 AM) Naproxen (500MG  Tablet, 1 (one) Oral two times daily, as needed, Taken starting 08/01/2016) Active. Cozaar (100MG  Tablet, Oral) Active. amLODIPine Besylate (5MG  Tablet, Oral) Active. Medications Reconciled     Review of Systems Angelica Sportsman, MD; 09/10/2018 11:6 AM) General Not Present- Appetite Loss, Chills, Fatigue, Fever, Night Sweats, Weight Gain and Weight Loss. Skin Not Present- Change in Wart/Mole, Dryness, Hives, Jaundice, New Lesions, Non-Healing Wounds, Rash and Ulcer. HEENT Not Present- Earache, Hearing Loss, Hoarseness, Nose Bleed, Oral Ulcers, Ringing in the Ears, Seasonal Allergies, Sinus Pain, Sore Throat, Visual Disturbances, Wears glasses/contact lenses and Yellow Eyes. Breast Not Present- Breast Mass, Breast Pain, Nipple Discharge and Skin Changes. Cardiovascular Not Present- Chest Pain, Difficulty Breathing Lying Down, Leg Cramps, Palpitations, Rapid Heart Rate, Shortness of Breath and Swelling of Extremities. Gastrointestinal Not Present- Abdominal Pain, Bloating, Bloody Stool, Change in Bowel Habits, Chronic diarrhea, Constipation, Difficulty Swallowing, Excessive gas, Gets full quickly at meals, Hemorrhoids, Indigestion, Nausea, Rectal Pain and Vomiting. Female Genitourinary Not Present- Frequency, Nocturia, Painful Urination, Pelvic Pain  and Urgency. Musculoskeletal Not Present- Back Pain, Joint Pain, Joint Stiffness,  Muscle Pain, Muscle Weakness and Swelling of Extremities. Neurological Not Present- Decreased Memory, Fainting, Headaches, Numbness, Seizures, Tingling, Tremor, Trouble walking and Weakness. Psychiatric Not Present- Anxiety, Bipolar, Change in Sleep Pattern, Depression, Fearful and Frequent crying. Endocrine Not Present- Cold Intolerance, Excessive Hunger, Hair Changes, Heat Intolerance, Hot flashes and New Diabetes. Hematology Not Present- Blood Thinners, Easy Bruising, Excessive bleeding, Gland problems, HIV and Persistent Infections.  Vitals ([name] CMA; 09/10/2018 10:43 AM) 09/10/2018 10:42 AM Weight: 164.25 lb Height: 66in Body Surface Area: 1.84 m Body Mass Index: 26.51 kg/m  Temp.: 96.69F(Temporal)  Pulse: 83 (Regular)  P.OX: 98% (Room air) BP: 108/62 (Sitting, Right Arm, Standard)      Physical Exam [name](Darey Hershberger C. Graceyn Fodor MD; 09/10/2018 11:11 AM)  General Mental Status-Alert. General Appearance-Not in acute distress, Not Sickly. Orientation-Oriented X3. Hydration-Well hydrated. Voice-Normal.  Integumentary Global Assessment Normal Exam - Axillae: non-tender, no inflammation or ulceration, no drainage. and Distribution of scalp and body hair is normal. General Characteristics Temperature - normal warmth is noted.  Head and Neck Head-normocephalic, atraumatic with no lesions or palpable masses. Face Global Assessment - atraumatic, no absence of expression. Neck Global Assessment - no abnormal movements, no bruit auscultated on the right, no bruit auscultated on the left, no decreased range of motion, non-tender. Trachea-midline. Thyroid Gland Characteristics - non-tender.  Eye Eyeball - Left-Extraocular movements intact, No Nystagmus. Eyeball - Right-Extraocular movements intact, No Nystagmus. Cornea - Left-No Hazy. Cornea - Right-No Hazy. Sclera/Conjunctiva - Left-No scleral icterus, No Discharge. Sclera/Conjunctiva -  Right-No scleral icterus, No Discharge. Pupil - Left-Direct reaction to light normal. Pupil - Right-Direct reaction to light normal. Note: Wears glasses. Vision corrected  ENMT Ears Pinna - Left - no drainage observed, no generalized tenderness observed. Right - no drainage observed, no generalized tenderness observed. Nose and Sinuses Nose - no destructive lesion observed. Nares - Left - quiet respiration. Right - quiet respiration. Mouth and Throat Lips - Upper Lip - no fissures observed, no pallor noted. Lower Lip - no fissures observed, no pallor noted. Nasopharynx - no discharge present. Oral Cavity/Oropharynx - Tongue - no dryness observed. Oral Mucosa - no cyanosis observed. Hypopharynx - no evidence of airway distress observed.  Chest and Lung Exam Inspection Movements - Normal and Symmetrical. Accessory muscles - No use of accessory muscles in breathing. Palpation Normal exam - Non-tender. Auscultation Breath sounds - Normal and Clear.  Cardiovascular Auscultation Rhythm - Regular. Murmurs & Other Heart Sounds - Normal exam - No Murmurs and No Systolic Clicks.  Abdomen Inspection Normal Exam - No Visible peristalsis and No Abnormal pulsations. Umbilicus - No Bleeding, No Urine drainage. Palpation/Percussion Normal exam - Soft, Non Tender, No Rebound tenderness, No Rigidity (guarding) and No Cutaneous hyperesthesia. Note: Abdomen soft. Nontender, nondistended. No guarding. No diastasis. No umbilical nor other hernias  Female Genitourinary Sexual Maturity Tanner 5 - Adult hair pattern. Note: Right groin discomfort with some subtle bulging with Valsalva. Mildly increased. I do feel an impulse with standing and coughing. Not fully collapsible but mobile. Not fixed. Seems more suggestive of a hernia to me.    No pain or symptoms or anything obvious on the left side. No pain in the inner thigh/femoral canal. No obvious femoral hernias there. No left  inguinal hernia. No inguinal lymphadenopathy. No major vaginal bleeding nor foul discharge  Peripheral Vascular Upper Extremity Inspection - Left - No Cyanotic nailbeds, Not Ischemic. Right - No Cyanotic nailbeds,  Not Ischemic.  Neurologic Neurologic evaluation reveals -normal attention span and ability to concentrate, able to name objects and repeat phrases. Appropriate fund of knowledge , normal sensation and normal coordination. Mental Status Affect - not angry, not paranoid. Cranial Nerves-Normal Bilaterally. Gait-Normal.  Neuropsychiatric Mental status exam performed with findings of-able to articulate well with normal speech/language, rate, volume and coherence, thought content normal with ability to perform basic computations and apply abstract reasoning and no evidence of hallucinations, delusions, obsessions or homicidal/suicidal ideation.  Musculoskeletal Global Assessment Spine, Ribs and Pelvis - no instability, subluxation or laxity. Right Upper Extremity - no instability, subluxation or laxity.  Lymphatic Head & Neck General Head & Neck Lymphatics: Bilateral - Description - No Localized lymphadenopathy. Axillary General Axillary Region: Bilateral - Description - No Localized lymphadenopathy. Femoral & Inguinal Generalized Femoral & Inguinal Lymphatics: Left: Right - Description - No Localized lymphadenopathy. Description - No Localized lymphadenopathy.    Assessment & Plan [name](Wynema Garoutte C. Gayanne Prescott MD; 09/10/2018 11:10 AM)  RIGHT INGUINAL HERNIA (K40.90) Impression: Right groin mass change with Valsalva or suspicious for inguinal hernia. Larger and more symptomatic 3 years later. I suppose she could have a female hydrocele through the canal of Nuck, but I'm skeptical that with a changing with positioned that in is a specific solid mass.  Groin exploration and been offered in 2014. I had recommended laparoscopic exploration and hernia surgery in 2017. I again think she  would benefit from laparoscopic exploration to see if she has any hernias and if so repair them with mesh in a preperitoneal fashion.  As completely negative, then groin exploration to remove this cystic mass in the right groin. I am skeptical that that is what is going on since she has intermittent swelling that she can reduce. She is interested in proceeding.  I think she again is coming in the winter hoping we can do a right away. We will try and work a convenient time  Current Plans Pt Education - [facility] (Patryk Conant)  RIGHT GROIN MASS (R19.09) Impression: Suspect her lump is just hernia sac. Not consistent with a lymph node, lipoma, endometrioma. If there is persistent mass after reducing the hernia that cannot be explained by hernia sac, I will do groin exploration through a more open incision if this is just mass cannot be reduced from the intraperitoneal/preperitoneal aspect. Hopefully likely we can do at that way though and avoid an extra incision. Regardless, the mass needs to go.   PREOP - ING HERNIA - ENCOUNTER FOR PREOPERATIVE EXAMINATION FOR GENERAL SURGICAL PROCEDURE (Z01.818)  Current Plans You are being scheduled for surgery- Our schedulers will call you.  You should hear from our office's scheduling department within 5 working days about the location, date, and time of surgery. We try to make accommodations for patient's preferences in scheduling surgery, but sometimes the OR schedule or the surgeon's schedule prevents us from making those accommodations.  If you have not heard from our office [phone]) in 5 working days, call the office and ask for your surgeon's nurse.  If you have other questions about your diagnosis, plan, or surgery, call the office and ask for your surgeon's nurse.  Written instructions provided The anatomy & physiology of the abdominal wall and pelvic floor was discussed. The pathophysiology of hernias in the inguinal and pelvic region  was discussed. Natural history risks such as progressive enlargement, pain, incarceration, and strangulation was discussed. Contributors to complications such as smoking, obesity, diabetes, prior surgery, etc were discussed.  I feel the risks of no intervention will lead to serious problems that outweigh the operative risks; therefore, I recommended surgery to reduce and repair the hernia. I explained laparoscopic techniques with possible need for an open approach. I noted usual use of mesh to patch and/or buttress hernia repair  Risks such as bleeding, infection, abscess, need for further treatment, heart attack, death, and other risks were discussed. I noted a good likelihood this will help address the problem. Goals of post-operative recovery were discussed as well. Possibility that this will not correct all symptoms was explained. I stressed the importance of low-impact activity, aggressive pain control, avoiding constipation, & not pushing through pain to minimize risk of post-operative chronic pain or injury. Possibility of reherniation was discussed. We will work to minimize complications.  An educational handout further explaining the pathology & treatment options was given as well. Questions were answered. The patient expresses understanding & wishes to proceed with surgery.  Pt Education - Pamphlet Given - Laparoscopic Hernia Repair: discussed with patient and provided information. Pt Education - [facility] Post-Op [facility] (Jeremie Giangrande): discussed with patient and provided information.  Angelica Sportsman, MD, FACS, MASCRS Gastrointestinal and Minimally Invasive Surgery    [address], [address] [place], [place] [address] [phone] Main / Paging [phone] Fax

## 2018-09-20 MED FILL — AMLODIPINE BESYLATE 5 MG TA: 5 | 90 days supply | Qty: 90 | Fill #1

## 2018-09-24 DIAGNOSIS — I10 Essential (primary) hypertension: Secondary | ICD-10-CM | POA: Diagnosis not present

## 2018-09-25 NOTE — Patient Instructions (Signed)
Aletha HalimFrancisca C Rothgeb  09/25/2018   Your procedure is scheduled on: 10-04-18   [facility]             Report to admitting at       0726  AM    Call this number if you have problems the morning of surgery [phone]    Remember:NO SOLID FOOD AFTER MIDNIGHT THE NIGHT PRIOR TO SURGERY. NOTHING BY MOUTH EXCEPT CLEAR LIQUIDS UNTIL 3 HOURS PRIOR TO SCHEULED SURGERY. PLEASE FINISH ENSURE DRINK PER SURGEON ORDER 3 HOURS PRIOR TO SCHEDULED SURGERY TIME WHICH NEEDS TO BE COMPLETED AT __0620 am then nothing by mouth __________.    CLEAR LIQUID DIET   Foods Allowed                                                                     Foods Excluded  Coffee and tea, regular and decaf                             liquids that you cannot  Plain Jell-O in any flavor                                             see through such as: Fruit ices (not with fruit pulp)                                     milk, soups, orange juice  Iced Popsicles                                    All solid food Carbonated beverages, regular and diet                                    Cranberry, grape and apple juices Sports drinks like Gatorade Lightly seasoned clear broth or consume(fat free) Sugar, honey syrup   _____________________________________________________________________    BRUSH YOUR TEETH MORNING OF SURGERY AND RINSE YOUR MOUTH OUT, NO CHEWING GUM CANDY OR MINTS.     Take these medicines the morning of surgery with A SIP OF WATER: amlodipine                                You may not have any metal on your body including hair pins and              piercings  Do not wear jewelry, make-up, lotions, powders or perfumes, deodorant             Do not wear nail polish.  Do not shave  48 hours prior to surgery.     Do not bring valuables to the hospital. [facility] IS NOT  RESPONSIBLE   FOR VALUABLES.  Contacts, dentures or bridgework may not  be worn into surgery.      Patients discharged the day of surgery will not be allowed to drive home. IF YOU ARE HAVING SURGERY AND GOING HOME THE SAME DAY, YOU MUST HAVE AN ADULT TO DRIVE YOU HOME AND BE WITH YOU FOR 24 HOURS. YOU MAY GO HOME BY TAXI OR UBER OR ORTHERWISE, BUT AN ADULT MUST ACCOMPANY YOU HOME AND STAY WITH YOU FOR 24 HOURS.  Name and phone number of your driver:  Special Instructions: N/A              Please read over the following fact sheets you were given: _____________________________________________________________________             [facility] - Preparing for Surgery Before surgery, you can play an important role.  Because skin is not sterile, your skin needs to be as free of germs as possible.  You can reduce the number of germs on your skin by washing with CHG (chlorahexidine gluconate) soap before surgery.  CHG is an antiseptic cleaner which kills germs and bonds with the skin to continue killing germs even after washing. Please DO NOT use if you have an allergy to CHG or antibacterial soaps.  If your skin becomes reddened/irritated stop using the CHG and inform your nurse when you arrive at Short Stay. Do not shave (including legs and underarms) for at least 48 hours prior to the first CHG shower.  You may shave your face/neck. Please follow these instructions carefully:  1.  Shower with CHG Soap the night before surgery and the  morning of Surgery.  2.  If you choose to wash your hair, wash your hair first as usual with your  normal  shampoo.  3.  After you shampoo, rinse your hair and body thoroughly to remove the  shampoo.                           4.  Use CHG as you would any other liquid soap.  You can apply chg directly  to the skin and wash                       Gently with a scrungie or clean washcloth.  5.  Apply the CHG Soap to your body ONLY FROM THE NECK DOWN.   Do not use on face/ open                           Wound or open sores. Avoid contact with  eyes, ears mouth and genitals (private parts).                       Wash face,  Genitals (private parts) with your normal soap.             6.  Wash thoroughly, paying special attention to the area where your surgery  will be performed.  7.  Thoroughly rinse your body with warm water from the neck down.  8.  DO NOT shower/wash with your normal soap after using and rinsing off  the CHG Soap.                9.  Pat yourself dry with a clean towel.            10.  Wear clean  pajamas.            11.  Place clean sheets on your bed the night of your first shower and do not  sleep with pets. Day of Surgery : Do not apply any lotions/deodorants the morning of surgery.  Please wear clean clothes to the hospital/surgery center.  FAILURE TO FOLLOW THESE INSTRUCTIONS MAY RESULT IN THE CANCELLATION OF YOUR SURGERY PATIENT SIGNATURE_________________________________  NURSE SIGNATURE__________________________________  ________________________________________________________________________

## 2018-09-27 ENCOUNTER — Encounter (HOSPITAL_COMMUNITY)
Admission: RE | Admit: 2018-09-27 | Discharge: 2018-09-27 | Disposition: A | Discharge status: Home / Self Care | Payer: 59 | Source: Ambulatory Visit | Attending: Surgery | Admitting: Surgery

## 2018-09-27 ENCOUNTER — Other Ambulatory Visit: Payer: Self-pay

## 2018-09-27 ENCOUNTER — Encounter (HOSPITAL_COMMUNITY): Payer: Self-pay

## 2018-09-27 DIAGNOSIS — Z01818 Encounter for other preprocedural examination: Secondary | ICD-10-CM | POA: Diagnosis not present

## 2018-09-27 DIAGNOSIS — K469 Unspecified abdominal hernia without obstruction or gangrene: Secondary | ICD-10-CM | POA: Insufficient documentation

## 2018-09-27 LAB — CBC
HCT: 40.9 % (ref 36.0–46.0)
Hemoglobin: 12.5 g/dL (ref 12.0–15.0)
MCH: 26 pg (ref 26.0–34.0)
MCHC: 30.6 g/dL (ref 30.0–36.0)
MCV: 85 fL (ref 80.0–100.0)
Platelets: 192 10*3/uL (ref 150–400)
RBC: 4.81 MIL/uL (ref 3.87–5.11)
RDW: 13.2 % (ref 11.5–15.5)
WBC: 4.1 10*3/uL (ref 4.0–10.5)
nRBC: 0 % (ref 0.0–0.2)

## 2018-09-27 LAB — BASIC METABOLIC PANEL
Anion gap: 7 (ref 5–15)
BUN: 16 mg/dL (ref 6–20)
CHLORIDE: 102 mmol/L (ref 98–111)
CO2: 32 mmol/L (ref 22–32)
Calcium: 9.5 mg/dL (ref 8.9–10.3)
Creatinine, Ser: 0.58 mg/dL (ref 0.44–1.00)
GFR calc Af Amer: >60 mL/min (ref >60)
GFR calc non Af Amer: >60 mL/min (ref >60)
Glucose, Bld: 102 mg/dL — ABNORMAL HIGH (ref 70–99)
Potassium: 3.5 mmol/L (ref 3.5–5.1)
Sodium: 141 mmol/L (ref 135–145)

## 2018-10-03 MED ORDER — BUPIVACAINE LIPOSOME 1.3 % IJ SUSP
20.0000 mL | INTRAMUSCULAR | Status: DC
  Filled 2018-10-03: qty 20

## 2018-10-04 ENCOUNTER — Ambulatory Visit (HOSPITAL_COMMUNITY): Payer: 59 | Admitting: Physician Assistant

## 2018-10-04 ENCOUNTER — Encounter (HOSPITAL_COMMUNITY): Payer: Self-pay | Admitting: *Deleted

## 2018-10-04 ENCOUNTER — Ambulatory Visit (HOSPITAL_COMMUNITY)
Admission: RE | Admit: 2018-10-04 | Discharge: 2018-10-05 | Disposition: A | Discharge status: Home / Self Care | Payer: 59 | Attending: Surgery | Admitting: Surgery

## 2018-10-04 ENCOUNTER — Ambulatory Visit (HOSPITAL_COMMUNITY): Payer: 59

## 2018-10-04 ENCOUNTER — Other Ambulatory Visit: Payer: Self-pay

## 2018-10-04 ENCOUNTER — Encounter (HOSPITAL_COMMUNITY)
Admission: RE | Disposition: A | Discharge status: Home / Self Care | Payer: Self-pay | Source: Home / Self Care | Attending: Surgery

## 2018-10-04 DIAGNOSIS — N9489 Other specified conditions associated with female genital organs and menstrual cycle: Secondary | ICD-10-CM | POA: Diagnosis not present

## 2018-10-04 DIAGNOSIS — Z79899 Other long term (current) drug therapy: Secondary | ICD-10-CM | POA: Insufficient documentation

## 2018-10-04 DIAGNOSIS — I10 Essential (primary) hypertension: Secondary | ICD-10-CM | POA: Insufficient documentation

## 2018-10-04 DIAGNOSIS — K4020 Bilateral inguinal hernia, without obstruction or gangrene, not specified as recurrent: Secondary | ICD-10-CM

## 2018-10-04 DIAGNOSIS — M7989 Other specified soft tissue disorders: Secondary | ICD-10-CM | POA: Diagnosis not present

## 2018-10-04 HISTORY — PX: INGUINAL HERNIA REPAIR: SHX194

## 2018-10-04 LAB — ABO/RH: ABO/RH(D): O NEG

## 2018-10-04 LAB — TYPE AND SCREEN
ABO/RH(D): O NEG
Antibody Screen: NEGATIVE

## 2018-10-04 SURGERY — REPAIR, HERNIA, INGUINAL, BILATERAL, LAPAROSCOPIC
Anesthesia: General | Site: Abdomen | Laterality: Right

## 2018-10-04 MED ORDER — GABAPENTIN 300 MG PO CAPS
300.0000 mg | ORAL_CAPSULE | Freq: Two times a day (BID) | ORAL | Status: DC
  Administered 2018-10-04: 300 mg via ORAL
  Filled 2018-10-04 (×2): qty 1

## 2018-10-04 MED ORDER — PROPOFOL 10 MG/ML IV BOLUS
INTRAVENOUS | Status: DC | PRN
  Administered 2018-10-04: 150 mg via INTRAVENOUS

## 2018-10-04 MED ORDER — DIPHENHYDRAMINE HCL 12.5 MG/5ML PO ELIX: 12.5000 mg | ORAL_SOLUTION | Freq: Four times a day (QID) | ORAL | Status: DC | PRN

## 2018-10-04 MED ORDER — LIDOCAINE 2% (20 MG/ML) 5 ML SYRINGE
INTRAMUSCULAR | Status: AC
  Filled 2018-10-04: qty 5

## 2018-10-04 MED ORDER — CHLORHEXIDINE GLUCONATE CLOTH 2 % EX PADS: 6.0000 | MEDICATED_PAD | Freq: Once | CUTANEOUS | Status: DC

## 2018-10-04 MED ORDER — HYDROCORTISONE 2.5 % RE CREA: 1.0000 "application " | TOPICAL_CREAM | Freq: Four times a day (QID) | RECTAL | Status: DC | PRN

## 2018-10-04 MED ORDER — GUAIFENESIN-DM 100-10 MG/5ML PO SYRP: 10.0000 mL | ORAL_SOLUTION | ORAL | Status: DC | PRN

## 2018-10-04 MED ORDER — CELECOXIB 200 MG PO CAPS
200.0000 mg | ORAL_CAPSULE | ORAL | Status: AC
  Administered 2018-10-04: 200 mg via ORAL
  Filled 2018-10-04: qty 1

## 2018-10-04 MED ORDER — PSYLLIUM 95 % PO PACK
1.0000 | PACK | Freq: Two times a day (BID) | ORAL | Status: DC
  Administered 2018-10-04 – 2018-10-05 (×2): 1 via ORAL
  Filled 2018-10-04 (×2): qty 1

## 2018-10-04 MED ORDER — ROCURONIUM BROMIDE 10 MG/ML (PF) SYRINGE
PREFILLED_SYRINGE | INTRAVENOUS | Status: DC | PRN
  Administered 2018-10-04: 50 mg via INTRAVENOUS
  Administered 2018-10-04: 10 mg via INTRAVENOUS
  Administered 2018-10-04: 20 mg via INTRAVENOUS
  Administered 2018-10-04: 10 mg via INTRAVENOUS

## 2018-10-04 MED ORDER — ZOLPIDEM TARTRATE 5 MG PO TABS: 5.0000 mg | ORAL_TABLET | Freq: Every evening | ORAL | Status: DC | PRN

## 2018-10-04 MED ORDER — METHOCARBAMOL 500 MG PO TABS: 750.0000 mg | ORAL_TABLET | Freq: Four times a day (QID) | ORAL | Status: DC | PRN

## 2018-10-04 MED ORDER — STERILE WATER FOR IRRIGATION IR SOLN
Status: DC | PRN
  Administered 2018-10-04: 1000 mL

## 2018-10-04 MED ORDER — POLYETHYLENE GLYCOL 3350 17 G PO PACK: 17.0000 g | PACK | Freq: Every day | ORAL | Status: DC | PRN

## 2018-10-04 MED ORDER — ALBUMIN HUMAN 5 % IV SOLN
INTRAVENOUS | Status: DC | PRN
  Administered 2018-10-04 (×2): via INTRAVENOUS

## 2018-10-04 MED ORDER — SODIUM CHLORIDE 0.9 % IV SOLN
INTRAVENOUS | Status: DC
  Administered 2018-10-04: 15:00:00 via INTRAVENOUS

## 2018-10-04 MED ORDER — CEFAZOLIN SODIUM-DEXTROSE 2-4 GM/100ML-% IV SOLN
2.0000 g | INTRAVENOUS | Status: AC
  Administered 2018-10-04: 2 g via INTRAVENOUS
  Filled 2018-10-04: qty 100

## 2018-10-04 MED ORDER — ALBUMIN HUMAN 5 % IV SOLN
INTRAVENOUS | Status: AC
  Filled 2018-10-04: qty 250

## 2018-10-04 MED ORDER — METOPROLOL TARTRATE 5 MG/5ML IV SOLN: 5.0000 mg | Freq: Four times a day (QID) | INTRAVENOUS | Status: DC | PRN

## 2018-10-04 MED ORDER — TRAMADOL HCL 50 MG PO TABS: 50.0000 mg | ORAL_TABLET | Freq: Four times a day (QID) | ORAL | 0 refills | Status: AC | PRN

## 2018-10-04 MED ORDER — LACTATED RINGERS IV BOLUS
1000.0000 mL | Freq: Three times a day (TID) | INTRAVENOUS | Status: DC | PRN
  Administered 2018-10-04: 1000 mL via INTRAVENOUS

## 2018-10-04 MED ORDER — SUGAMMADEX SODIUM 500 MG/5ML IV SOLN
INTRAVENOUS | Status: DC | PRN
  Administered 2018-10-04: 300 mg via INTRAVENOUS

## 2018-10-04 MED ORDER — LIDOCAINE 2% (20 MG/ML) 5 ML SYRINGE
INTRAMUSCULAR | Status: DC | PRN
  Administered 2018-10-04: 70 mg via INTRAVENOUS

## 2018-10-04 MED ORDER — KETAMINE HCL 10 MG/ML IJ SOLN
INTRAMUSCULAR | Status: DC | PRN
  Administered 2018-10-04 (×2): 15 mg via INTRAVENOUS

## 2018-10-04 MED ORDER — FENTANYL CITRATE (PF) 250 MCG/5ML IJ SOLN
INTRAMUSCULAR | Status: AC
  Filled 2018-10-04: qty 5

## 2018-10-04 MED ORDER — GABAPENTIN 300 MG PO CAPS
300.0000 mg | ORAL_CAPSULE | ORAL | Status: AC
  Administered 2018-10-04: 300 mg via ORAL
  Filled 2018-10-04: qty 1

## 2018-10-04 MED ORDER — BUPIVACAINE LIPOSOME 1.3 % IJ SUSP
INTRAMUSCULAR | Status: DC | PRN
  Administered 2018-10-04: 20 mL

## 2018-10-04 MED ORDER — HYDRALAZINE HCL 20 MG/ML IJ SOLN: 5.0000 mg | INTRAMUSCULAR | Status: DC | PRN

## 2018-10-04 MED ORDER — ACETAMINOPHEN 500 MG PO TABS
1000.0000 mg | ORAL_TABLET | ORAL | Status: AC
  Administered 2018-10-04: 1000 mg via ORAL
  Filled 2018-10-04: qty 2

## 2018-10-04 MED ORDER — EPHEDRINE 5 MG/ML INJ
INTRAVENOUS | Status: AC
  Filled 2018-10-04: qty 10

## 2018-10-04 MED ORDER — PROCHLORPERAZINE EDISYLATE 10 MG/2ML IJ SOLN: 5.0000 mg | Freq: Four times a day (QID) | INTRAMUSCULAR | Status: DC | PRN

## 2018-10-04 MED ORDER — PROCHLORPERAZINE MALEATE 10 MG PO TABS: 10.0000 mg | ORAL_TABLET | Freq: Four times a day (QID) | ORAL | Status: DC | PRN

## 2018-10-04 MED ORDER — ENOXAPARIN SODIUM 40 MG/0.4ML ~~LOC~~ SOLN: 40.0000 mg | SUBCUTANEOUS | Status: DC

## 2018-10-04 MED ORDER — HYDROMORPHONE HCL 1 MG/ML IJ SOLN
0.5000 mg | INTRAMUSCULAR | Status: DC | PRN
  Administered 2018-10-04: 1 mg via INTRAVENOUS
  Filled 2018-10-04: qty 2

## 2018-10-04 MED ORDER — ALBUMIN HUMAN 5 % IV SOLN
INTRAVENOUS | Status: AC
  Filled 2018-10-04: qty 500

## 2018-10-04 MED ORDER — ENALAPRILAT 1.25 MG/ML IV SOLN
0.6250 mg | Freq: Four times a day (QID) | INTRAVENOUS | Status: DC | PRN
  Filled 2018-10-04: qty 1

## 2018-10-04 MED ORDER — MIDAZOLAM HCL 2 MG/2ML IJ SOLN
INTRAMUSCULAR | Status: AC
  Filled 2018-10-04: qty 2

## 2018-10-04 MED ORDER — FENTANYL CITRATE (PF) 100 MCG/2ML IJ SOLN
25.0000 ug | INTRAMUSCULAR | Status: DC | PRN
  Administered 2018-10-04: 25 ug via INTRAVENOUS
  Administered 2018-10-04: 50 ug via INTRAVENOUS
  Administered 2018-10-04: 25 ug via INTRAVENOUS

## 2018-10-04 MED ORDER — ONDANSETRON HCL 4 MG/2ML IJ SOLN: 4.0000 mg | Freq: Four times a day (QID) | INTRAMUSCULAR | Status: DC | PRN

## 2018-10-04 MED ORDER — PHENYLEPHRINE 40 MCG/ML (10ML) SYRINGE FOR IV PUSH (FOR BLOOD PRESSURE SUPPORT)
PREFILLED_SYRINGE | INTRAVENOUS | Status: DC | PRN
  Administered 2018-10-04 (×4): 120 ug via INTRAVENOUS

## 2018-10-04 MED ORDER — PHENOL 1.4 % MT LIQD: 1.0000 | OROMUCOSAL | Status: DC | PRN

## 2018-10-04 MED ORDER — LACTATED RINGERS IV SOLN
INTRAVENOUS | Status: DC | PRN
  Administered 2018-10-04: 09:00:00 via INTRAVENOUS

## 2018-10-04 MED ORDER — KETAMINE HCL 10 MG/ML IJ SOLN
INTRAMUSCULAR | Status: AC
  Filled 2018-10-04: qty 1

## 2018-10-04 MED ORDER — PHENYLEPHRINE 40 MCG/ML (10ML) SYRINGE FOR IV PUSH (FOR BLOOD PRESSURE SUPPORT)
PREFILLED_SYRINGE | INTRAVENOUS | Status: AC
  Filled 2018-10-04: qty 10

## 2018-10-04 MED ORDER — BUPIVACAINE HCL (PF) 0.25 % IJ SOLN
INTRAMUSCULAR | Status: DC | PRN
  Administered 2018-10-04: 50 mL

## 2018-10-04 MED ORDER — HYDROCORTISONE 1 % EX CREA: 1.0000 "application " | TOPICAL_CREAM | Freq: Three times a day (TID) | CUTANEOUS | Status: DC | PRN

## 2018-10-04 MED ORDER — ACETAMINOPHEN 500 MG PO TABS
1000.0000 mg | ORAL_TABLET | Freq: Three times a day (TID) | ORAL | Status: DC
  Administered 2018-10-04 – 2018-10-05 (×2): 1000 mg via ORAL
  Filled 2018-10-04 (×2): qty 2

## 2018-10-04 MED ORDER — LACTATED RINGERS IR SOLN
Status: DC | PRN
  Administered 2018-10-04: 1000 mL

## 2018-10-04 MED ORDER — LACTATED RINGERS IV SOLN
INTRAVENOUS | Status: DC
  Administered 2018-10-04: 09:00:00 via INTRAVENOUS
  Administered 2018-10-04: 1000 mL via INTRAVENOUS

## 2018-10-04 MED ORDER — OXYCODONE HCL 5 MG PO TABS
5.0000 mg | ORAL_TABLET | ORAL | Status: DC | PRN
  Administered 2018-10-04: 5 mg via ORAL
  Filled 2018-10-04: qty 1

## 2018-10-04 MED ORDER — BISACODYL 10 MG RE SUPP: 10.0000 mg | Freq: Every day | RECTAL | Status: DC | PRN

## 2018-10-04 MED ORDER — ONDANSETRON 4 MG PO TBDP: 4.0000 mg | ORAL_TABLET | Freq: Four times a day (QID) | ORAL | Status: DC | PRN

## 2018-10-04 MED ORDER — 0.9 % SODIUM CHLORIDE (POUR BTL) OPTIME
TOPICAL | Status: DC | PRN
  Administered 2018-10-04: 1000 mL

## 2018-10-04 MED ORDER — LIP MEDEX EX OINT
1.0000 "application " | TOPICAL_OINTMENT | Freq: Two times a day (BID) | CUTANEOUS | Status: DC
  Administered 2018-10-04 (×2): 1 via TOPICAL
  Filled 2018-10-04 (×3): qty 7

## 2018-10-04 MED ORDER — LACTATED RINGERS IV SOLN: 1000.0000 mL | Freq: Three times a day (TID) | INTRAVENOUS | Status: DC | PRN

## 2018-10-04 MED ORDER — LIDOCAINE 2% (20 MG/ML) 5 ML SYRINGE
INTRAMUSCULAR | Status: DC | PRN
  Administered 2018-10-04: 1.5 mg/kg/h via INTRAVENOUS

## 2018-10-04 MED ORDER — SIMETHICONE 80 MG PO CHEW: 40.0000 mg | CHEWABLE_TABLET | Freq: Four times a day (QID) | ORAL | Status: DC | PRN

## 2018-10-04 MED ORDER — MAGIC MOUTHWASH
15.0000 mL | Freq: Four times a day (QID) | ORAL | Status: DC | PRN
  Filled 2018-10-04: qty 15

## 2018-10-04 MED ORDER — SCOPOLAMINE 1 MG/3DAYS TD PT72
MEDICATED_PATCH | TRANSDERMAL | Status: DC | PRN
  Administered 2018-10-04: 1 via TRANSDERMAL

## 2018-10-04 MED ORDER — DEXAMETHASONE SODIUM PHOSPHATE 10 MG/ML IJ SOLN
INTRAMUSCULAR | Status: DC | PRN
  Administered 2018-10-04: 8 mg via INTRAVENOUS

## 2018-10-04 MED ORDER — FENTANYL CITRATE (PF) 250 MCG/5ML IJ SOLN
INTRAMUSCULAR | Status: DC | PRN
  Administered 2018-10-04 (×3): 50 ug via INTRAVENOUS

## 2018-10-04 MED ORDER — MIDAZOLAM HCL 2 MG/2ML IJ SOLN
INTRAMUSCULAR | Status: DC | PRN
  Administered 2018-10-04: 2 mg via INTRAVENOUS

## 2018-10-04 MED ORDER — EPHEDRINE SULFATE-NACL 50-0.9 MG/10ML-% IV SOSY
PREFILLED_SYRINGE | INTRAVENOUS | Status: DC | PRN
  Administered 2018-10-04: 5 mg via INTRAVENOUS

## 2018-10-04 MED ORDER — NAPROXEN 500 MG PO TABS: 500.0000 mg | ORAL_TABLET | Freq: Two times a day (BID) | ORAL | 2 refills | Status: AC | PRN

## 2018-10-04 MED ORDER — MENTHOL 3 MG MT LOZG: 1.0000 | LOZENGE | OROMUCOSAL | Status: DC | PRN

## 2018-10-04 MED ORDER — PROPOFOL 10 MG/ML IV BOLUS
INTRAVENOUS | Status: AC
  Filled 2018-10-04: qty 20

## 2018-10-04 MED ORDER — ONDANSETRON HCL 4 MG/2ML IJ SOLN: 4.0000 mg | Freq: Once | INTRAMUSCULAR | Status: DC | PRN

## 2018-10-04 MED ORDER — BUPIVACAINE HCL (PF) 0.25 % IJ SOLN
INTRAMUSCULAR | Status: AC
  Filled 2018-10-04: qty 60

## 2018-10-04 MED ORDER — FENTANYL CITRATE (PF) 100 MCG/2ML IJ SOLN
INTRAMUSCULAR | Status: AC
  Filled 2018-10-04: qty 2

## 2018-10-04 MED ORDER — ROCURONIUM BROMIDE 100 MG/10ML IV SOLN
INTRAVENOUS | Status: AC
  Filled 2018-10-04: qty 1

## 2018-10-04 MED ORDER — ALUM & MAG HYDROXIDE-SIMETH 200-200-20 MG/5ML PO SUSP: 30.0000 mL | Freq: Four times a day (QID) | ORAL | Status: DC | PRN

## 2018-10-04 MED ORDER — DIPHENHYDRAMINE HCL 50 MG/ML IJ SOLN: 12.5000 mg | Freq: Four times a day (QID) | INTRAMUSCULAR | Status: DC | PRN

## 2018-10-04 MED ORDER — ONDANSETRON HCL 4 MG/2ML IJ SOLN
INTRAMUSCULAR | Status: DC | PRN
  Administered 2018-10-04: 4 mg via INTRAVENOUS

## 2018-10-04 MED ORDER — SCOPOLAMINE 1 MG/3DAYS TD PT72
MEDICATED_PATCH | TRANSDERMAL | Status: AC
  Filled 2018-10-04: qty 1

## 2018-10-04 MED FILL — NAPROXEN 500 MG TABLET: 500 | 15 days supply | Qty: 30 | Fill #0 | Status: TO

## 2018-10-04 MED FILL — traMADol HCL 50 MG TABS: 50 | 3 days supply | Qty: 20 | Fill #0

## 2018-10-04 SURGICAL SUPPLY — 39 items
CABLE HIGH FREQUENCY MONO STRZ (ELECTRODE) ×3 IMPLANT
CHLORAPREP W/TINT 26ML (MISCELLANEOUS) ×3 IMPLANT
COVER SURGICAL LIGHT HANDLE (MISCELLANEOUS) ×3 IMPLANT
COVER WAND RF STERILE (DRAPES) IMPLANT
DECANTER SPIKE VIAL GLASS SM (MISCELLANEOUS) ×3 IMPLANT
DEVICE SECURE STRAP 25 ABSORB (INSTRUMENTS) ×3 IMPLANT
DRAPE WARM FLUID 44X44 (DRAPE) ×3 IMPLANT
DRSG TEGADERM 2-3/8X2-3/4 SM (GAUZE/BANDAGES/DRESSINGS) ×4 IMPLANT
DRSG TEGADERM 4X4.75 (GAUZE/BANDAGES/DRESSINGS) ×3 IMPLANT
ELECT REM PT RETURN 15FT ADLT (MISCELLANEOUS) ×3 IMPLANT
GAUZE SPONGE 2X2 8PLY STRL LF (GAUZE/BANDAGES/DRESSINGS) ×1 IMPLANT
GLOVE ECLIPSE 8.0 STRL XLNG CF (GLOVE) ×3 IMPLANT
GLOVE INDICATOR 8.0 STRL GRN (GLOVE) ×3 IMPLANT
GOWN STRL REUS W/TWL XL LVL3 (GOWN DISPOSABLE) ×6 IMPLANT
HEMOSTAT SURGICEL 4X8 (HEMOSTASIS) ×8 IMPLANT
IRRIG SUCT STRYKERFLOW 2 WTIP (MISCELLANEOUS) ×3
IRRIGATION SUCT STRKRFLW 2 WTP (MISCELLANEOUS) ×1 IMPLANT
KIT BASIN OR (CUSTOM PROCEDURE TRAY) ×3 IMPLANT
MESH ULTRAPRO 6X6 15CM15CM (Mesh General) ×6 IMPLANT
NDL INSUFFLATION 14GA 120MM (NEEDLE) IMPLANT
NEEDLE INSUFFLATION 14GA 120MM (NEEDLE) IMPLANT
PAD POSITIONING PINK XL (MISCELLANEOUS) ×3 IMPLANT
SCISSORS LAP 5X35 DISP (ENDOMECHANICALS) ×3 IMPLANT
SET TUBE SMOKE EVAC HIGH FLOW (TUBING) ×3 IMPLANT
SLEEVE ADV FIXATION 5X100MM (TROCAR) ×3 IMPLANT
SPONGE GAUZE 2X2 STER 10/PKG (GAUZE/BANDAGES/DRESSINGS) ×2
SUT MNCRL AB 4-0 PS2 18 (SUTURE) ×3 IMPLANT
SUT PDS AB 1 CT1 27 (SUTURE) ×6 IMPLANT
SUT VIC AB 2-0 SH 27 (SUTURE) ×12
SUT VIC AB 2-0 SH 27X BRD (SUTURE) IMPLANT
SUT VICRYL 0 UR6 27IN ABS (SUTURE) ×3 IMPLANT
SUT VLOC 180 0 9IN  GS21 (SUTURE) ×2
SUT VLOC 180 0 9IN GS21 (SUTURE) IMPLANT
TACKER 5MM HERNIA 3.5CML NAB (ENDOMECHANICALS) IMPLANT
TOWEL OR 17X26 10 PK STRL BLUE (TOWEL DISPOSABLE) ×3 IMPLANT
TOWEL OR NON WOVEN STRL DISP B (DISPOSABLE) ×3 IMPLANT
TRAY LAPAROSCOPIC (CUSTOM PROCEDURE TRAY) ×3 IMPLANT
TROCAR ADV FIXATION 5X100MM (TROCAR) ×3 IMPLANT
TROCAR XCEL BLUNT TIP 100MML (ENDOMECHANICALS) ×3 IMPLANT

## 2018-10-04 NOTE — Op Note (Signed)
10/04/2018  12:33 PM  PATIENT:  [name]  53 y.o. female  Patient Care Team: Mirna Mires, MD as PCP - General (Family Medicine) Griselda Miner, MD as Consulting Physician (General Surgery) Karie Soda, MD as Consulting Physician (General Surgery)  PRE-OPERATIVE DIAGNOSIS:  Hernias in right and possibly left groins  POST-OPERATIVE DIAGNOSIS:   Bilateral Inguinal hernias Cyst mass of right round ligament in canal of Nuck within right inguinal hernia  PROCEDURE:   LAPAROSCOPIC BILATERAL INGUINAL HERNIA REPAIR WITH MESH  REMOVAL OF CYSTIC MASS WITH RIGHT ROUND LIGAMENT TAP BLOCK  SURGEON:  [name], MD  ASSISTANT: None  ANESTHESIA:     Regional ilioinguinal and genitofemoral and spermatic cord nerve blocks  General  Nerve block provided with liposomal bupivacaine (Experel) mixed with 0.25% bupivacaine as a Bilateral TAP block x 50mL each side at the level of the transverse abdominis & preperitoneal spaces along the flank at the anterior axillary line, from subcostal ridge to iliac crest under laparoscopic guidance    EBL:  Total I/O In: 750 [IV Piggyback:750] Out: 750 [Blood:750].  See anesthesia record  Delay start of Pharmacological VTE agent (>24hrs) due to surgical blood loss or risk of bleeding:  no  DRAINS: NONE  SPECIMEN:   CYSTIC MASS WITH RIGHT ROUND LIGAMENT  DISPOSITION OF SPECIMEN:  N/A  COUNTS:  YES  PLAN OF CARE: Discharge to home after PACU  PATIENT DISPOSITION:  PACU - hemodynamically stable.  INDICATION: Patient with history of right groin mass and pain.  Evaluated in the past for concern of possible mass.  Held off on surgery.  However felt worse.  Felt something more obvious.  Possible cystic mass versus hernia.  Hernia suspected on my exam.  I recommended laparoscopic exploration and repair of hernias found.  Possible excision of forehead mass  The anatomy & physiology of the abdominal wall and pelvic floor was discussed.  The  pathophysiology of hernias in the inguinal and pelvic region was discussed.  Natural history risks such as progressive enlargement, pain, incarceration & strangulation was discussed.   Contributors to complications such as smoking, obesity, diabetes, prior surgery, etc were discussed.    I feel the risks of no intervention will lead to serious problems that outweigh the operative risks; therefore, I recommended surgery to reduce and repair the hernia.  I explained laparoscopic techniques with possible need for an open approach.  I noted usual use of mesh to patch and/or buttress hernia repair  Risks such as bleeding, infection, abscess, need for further treatment, heart attack, death, and other risks were discussed.  I noted a good likelihood this will help address the problem.   Goals of post-operative recovery were discussed as well.  Possibility that this will not correct all symptoms was explained.  I stressed the importance of low-impact activity, aggressive pain control, avoiding constipation, & not pushing through pain to minimize risk of post-operative chronic pain or injury. Possibility of reherniation was discussed.  We will work to minimize complications.     An educational handout further explaining the pathology & treatment options was given as well.  Questions were answered.  The patient expresses understanding & wishes to proceed with surgery.  OR FINDINGS: Dense preperitoneal adhesions from prior Pfannenstiel incisions.  Very stretched out elastic tissues.  Indirect right inguinal hernia containing cystic mass within the canal consistent with a patent canal of Nuck.  Densely adherent to the round ligament.  No direct space femoral obturator hernia.  Her  on the left side smaller but definite indirect inguinal hernia.  No direct space, femoral, nor obturator hernia  DESCRIPTION:  The patient was identified & brought into the operating room. The patient was positioned supine with arms  tucked. SCDs were active during the entire case. The patient underwent general anesthesia without any difficulty.  The abdomen was prepped and draped in a sterile fashion. The patient's bladder was emptied.  A Surgical Timeout confirmed our plan.  I made a transverse incision through the inferior umbilical fold.  I made a small transverse nick through the anterior rectus fascia contralateral to the inguinal hernia side and placed a 0-vicryl stitch through the fascia.  I placed a Hasson trocar into the preperitoneal plane.  Entry was clean.  We induced carbon dioxide insufflation. Camera inspection revealed no injury.  I used a 10mm angled scope to bluntly free the peritoneum off the infraumbilical anterior abdominal wall.  Some rather dense midline recurrently adhesions consistent with her prior cesarean sections.  Having to sharply freed the central peritoneum down breaches.  There are no abdominal adhesions to the peritoneum.  It was somewhat chronic but came down easily.  Patient of her window to clean her right lower quadrant and flank preperitoneal space.  Was able to get the other port right midabdomen.  I focused attention on the RIGHT pelvis since that was the dominant hernia side.   I used blunt & focused sharp dissection to free the peritoneum off the flank and down to the pubic rim.  I freed the anteriolateral bladder wall off the anteriolateral pelvic wall, sparing midline attachments.   I located a swath of peritoneum going into a hernia fascial defect at the  internal ring consistent with  an indirect inguinal hernia..  I gradually freed the peritoneal hernia sac off safely and reduced it into the preperitoneal space.  With that it became apparent that there was a cystic mass incidentally adherent to the round ligament and the hernia sac.  Stated that she did have a patent canal of Nuck with a cystic mass suspected on CT scan in 2015.  I worked to free the hernia sac and the cystic mass off the  round ligament but it was is densely adherent & could not be freed off.  I decided to excise the right round ligament en bloc to remove the cystic mass I did that with careful focused sharp dissection.  I had to excise peritoneum at the base of the hernia sac and round ligament close to its connection to the uterus.  Closed the peritoneal defect using 2-0 Vicryl suture using laparoscopic suturing.   Bladder was enlarged.  There was some adhesions from the prior C-section.  And freeing the bladder to create a window in the anterior pelvis I encountered some brisk venous bleeding.  Consistent with obturator vein bleeding.  Was able to hold pressure & control.  Is able to isolate the bleeding from the obturator vein closer to the pelvic side.  I did that with 2-0 Vicryl figure-of-eight sutures x2 since it was closer to flush with the pelvis..  Somewhat contracted into the pelvic bone.  There is still with some diffuse oozing.  I placed Ray-Tec packs x2 laparoscopically pelvis and that provided excellent hemostasis.  I inspected on the bladder side as well as the retroperitoneum and confirm no bleeding from the bladder or internal iliacs just moderate oozing on the pelvic sidewall.  I decided to keep the packs in place to avoid  further tissue bleeding or avulsion.    I turned attention on the opposite  LEFT pelvis.  I did dissection in a similar, mirror-image fashion. The patient had an indirect inguinal hernia..   No cystic mass.  Round ligament normal.  I freed hernia sac peritoneum off the round ligament the takeoff from the uterus.  I did have to open up the peritoneum somewhat to do that.  Close the defect with 2-0 Vicryl figure-of-eight suture.  Spermatic cord lipoma was dissected away & removed.    Hemostasis good on that side.  I closed the midline peritoneal defect with a locking V lock serrated 2-0 suture to pretty good result back to the umbilical port.  I chose 15x15 cm sheets of ultra-lightweight  polypropylene mesh (Ultrapro), one for each side.  I cut a single sigmoid-shaped slit ~6cm from a corner of each mesh.  I placed the meshes into the preperitoneal space & laid them as overlapping diamonds such that at the inferior points, a 6x6 cm corner flap rested in the true anterolateral pelvis, covering the obturator & femoral foramina.    Started on the left side.  That laid well.  I removed the packing in the right pelvis.  Still some oozing but not intense.  I placed a few sheets of Surgicel and packed down there for good hemostasis.  I laid the other ultra pro sheet and a mirror-image fashion with the medial inferior flap overlying the Surgicel packing in help reinforce gentle pressure to that right pelvic sidewall.  Again hemostasis good.I allowed the bladder to return to the pubis, this helping tuck the corners of the mesh in the anteriolateral pelvis.  The medial corners overlapped each other across midline cephalad to the pubic rim.   This provided >2 inch coverage around the hernias. Because the defects well covered and not particularly large, I did not place a central mesh.    There was still peritoneal defect just below the umbilical port.  I redirected the camera into the true peritoneal cavity.  I confirmed there was good closure on most of the peritoneum inferiorly.  No entrapped bowel or any other abnormalities.  I redirected the right and left paramedian ports into the true peritoneal cavity under laparoscopic intraperitoneal visualization.  I confirmed there a few small Swiss cheese holes around the umbilical port.  I decided to use an absorbable secure strap tacker to help tack those closed to the central anterior abdominal wall.  I also use the tacker to help tacked the peritoneum to the mesh to help close off any potential dead space.  There was no exposed mesh as peritoneum was well closed.  I could expect down into the intraperitoneal pelvis and saw no expanding hematoma or anything  else of concern.  That was reassuring. I evacuated carbon dioxide.  I closed the fascia umbilicus with #1 PDS interrupted suture.  No definite umbilical hernia, just flattened out.  I closed the skin using 4-0 monocryl stitch.  Sterile dressings were applied.   The patient was extubated & arrived in the PACU in stable condition.  Because requiring the excision of the round ligament cystic mass and the venous bleeding, I felt it would be wise to watch her overnight to prove stability and stable hemoglobin.  Anesthesia agreed.  I had discussed postoperative care with the patient in the holding area.  Instructions are written in the chart.  I discussed operative findings, updated the patient's status, discussed probable steps to recovery, and  gave postoperative recommendations to the patient's spouse.   Recommendations were made.  Questions were answered.  He expressed understanding & appreciation.   [name], M.D., F.A.C.S. Gastrointestinal and Minimally Invasive Surgery [facility] [address], [address] [place], [place] [address] [phone] Main / Paging  10/04/2018 12:33 PM

## 2018-10-04 NOTE — Discharge Instructions (Signed)
HERNIA REPAIR: POST OP INSTRUCTIONS ° °###################################################################### ° °EAT °Gradually transition to a high fiber diet with a fiber supplement over the next few weeks after discharge.  Start with a pureed / full liquid diet (see below) ° °WALK °Walk an hour a day.  Control your pain to do that.   ° °CONTROL PAIN °Control pain so that you can walk, sleep, tolerate sneezing/coughing, and go up/down stairs. ° °HAVE A BOWEL MOVEMENT DAILY °Keep your bowels regular to avoid problems.  OK to try a laxative to override constipation.  OK to use an antidairrheal to slow down diarrhea.  Call if not better after 2 tries ° °CALL IF YOU HAVE PROBLEMS/CONCERNS °Call if you are still struggling despite following these instructions. °Call if you have concerns not answered by these instructions ° °###################################################################### ° ° ° °1. DIET: Follow a light bland diet the first 24 hours after arrival home, such as soup, liquids, crackers, etc.  Be sure to include lots of fluids daily.  Advance to a low fat / high fiber diet over the next few days after surgery.  Avoid fast food or heavy meals the first week as your are more likely to get nauseated.   ° °2. Take your usually prescribed home medications unless otherwise directed. ° °3. PAIN CONTROL: °a. Pain is best controlled by a usual combination of three different methods TOGETHER: °i. Ice/Heat °ii. Over the counter pain medication °iii. Prescription pain medication °b. Most patients will experience some swelling and bruising around the hernia(s) such as the bellybutton, groins, or old incisions.  Ice packs or heating pads (30-60 minutes up to 6 times a day) will help. Use ice for the first few days to help decrease swelling and bruising, then switch to heat to help relax tight/sore spots and speed recovery.  Some people prefer to use ice alone, heat alone, alternating between ice & heat.  Experiment  to what works for you.  Swelling and bruising can take several weeks to resolve.   °c. It is helpful to take an over-the-counter pain medication regularly for the first few weeks.  Choose one of the following that works best for you: °i. Naproxen (Aleve, etc)  Two 220mg tabs twice a day °ii. Ibuprofen (Advil, etc) Three 200mg tabs four times a day (every meal & bedtime) °iii. Acetaminophen (Tylenol, etc) 325-650mg four times a day (every meal & bedtime) °d. A  prescription for pain medication should be given to you upon discharge.  Take your pain medication as prescribed.  °i. If you are having problems/concerns with the prescription medicine (does not control pain, nausea, vomiting, rash, itching, etc), please call us [phone] to see if we need to switch you to a different pain medicine that will work better for you and/or control your side effect better. °ii. If you need a refill on your pain medication, please contact your pharmacy.  They will contact our office to request authorization. Prescriptions will not be filled after 5 pm or on week-ends. ° °4. Avoid getting constipated.  Between the surgery and the pain medications, it is common to experience some constipation.  Increasing fluid intake and taking a fiber supplement (such as Metamucil, Citrucel, FiberCon, MiraLax, etc) 1-2 times a day regularly will usually help prevent this problem from occurring.  A mild laxative (prune juice, Milk of Magnesia, MiraLax, etc) should be taken according to package directions if there are no bowel movements after 48 hours.   ° °5. Wash / shower every   day.  You may shower over the dressings as they are waterproof.   ° °6. Remove your waterproof bandages, skin tapes, and other bandages 5 days after surgery. You may replace a dressing/Band-Aid to cover the incision for comfort if you wish. You may leave the incisions open to air.  You may replace a dressing/Band-Aid to cover an incision for comfort if you wish.   Continue to shower over incision(s) after the dressing is off. ° °7. ACTIVITIES as tolerated:   °a. You may resume regular (light) daily activities beginning the next day--such as daily self-care, walking, climbing stairs--gradually increasing activities as tolerated.  Control your pain so that you can walk an hour a day.  If you can walk 30 minutes without difficulty, it is safe to try more intense activity such as jogging, treadmill, bicycling, low-impact aerobics, swimming, etc. °b. Save the most intensive and strenuous activity for last such as sit-ups, heavy lifting, contact sports, etc  Refrain from any heavy lifting or straining until you are off narcotics for pain control.   °c. DO NOT PUSH THROUGH PAIN.  Let pain be your guide: If it hurts to do something, don't do it.  Pain is your body warning you to avoid that activity for another week until the pain goes down. °d. You may drive when you are no longer taking prescription pain medication, you can comfortably wear a seatbelt, and you can safely maneuver your car and apply brakes. °e. You may have sexual intercourse when it is comfortable.  ° °8. FOLLOW UP in our office °a. Please call [facility] at [phone] to set up an appointment to see your surgeon in the office for a follow-up appointment approximately 2-3 weeks after your surgery. °b. Make sure that you call for this appointment the day you arrive home to insure a convenient appointment time. ° °9.  If you have disability of FMLA / Family leave forms, please bring the forms to the office for processing.  (do not give to your surgeon). ° °WHEN TO CALL US [phone]: °1. Poor pain control °2. Reactions / problems with new medications (rash/itching, nausea, etc)  °3. Fever over 101.5 F (38.5 C) °4. Inability to urinate °5. Nausea and/or vomiting °6. Worsening swelling or bruising °7. Continued bleeding from incision. °8. Increased pain, redness, or drainage from the incision ° ° The clinic staff is  available to answer your questions during regular business hours (8:30am-5pm).  Please don’t hesitate to call and ask to speak to one of our nurses for clinical concerns.  ° If you have a medical emergency, go to the nearest emergency room or call 911. ° A surgeon from [facility] is always on call at the hospitals in [place] ° °[facility] °[address], [address], [place], [place]  [address] ? ° [address] [address], [place], [place]   [address] °MAIN: [phone] ? TOLL FREE: [phone] ? FAX: [phone] °[email or web address] ° °

## 2018-10-04 NOTE — Anesthesia Preprocedure Evaluation (Signed)
Anesthesia Evaluation  Patient identified by MRN, date of birth, ID band Patient awake    Reviewed: Allergy & Precautions, NPO status , Patient's Chart, lab work & pertinent test results  Airway Mallampati: II  TM Distance: >3 FB Neck ROM: Full    Dental  (+) Dental Advisory Given   Pulmonary neg pulmonary ROS,    breath sounds clear to auscultation       Cardiovascular hypertension, Pt. on medications  Rhythm:Regular Rate:Normal     Neuro/Psych negative neurological ROS     GI/Hepatic negative GI ROS, Neg liver ROS,   Endo/Other  negative endocrine ROS  Renal/GU negative Renal ROS     Musculoskeletal   Abdominal   Peds  Hematology negative hematology ROS (+)   Anesthesia Other Findings   Reproductive/Obstetrics                             Lab Results  Component Value Date   WBC 4.1 09/27/2018   HGB 12.5 09/27/2018   HCT 40.9 09/27/2018   MCV 85.0 09/27/2018   PLT 192 09/27/2018   Lab Results  Component Value Date   CREATININE 0.58 09/27/2018   BUN 16 09/27/2018   NA 141 09/27/2018   K 3.5 09/27/2018   CL 102 09/27/2018   CO2 32 09/27/2018    Anesthesia Physical Anesthesia Plan  ASA: II  Anesthesia Plan: General   Post-op Pain Management:    Induction: Intravenous  PONV Risk Score and Plan: 3 and 4 or greater and Dexamethasone, Midazolam, Ondansetron, Treatment may vary due to age or medical condition and Scopolamine patch - Pre-op  Airway Management Planned: Oral ETT  Additional Equipment:   Intra-op Plan:   Post-operative Plan: Extubation in OR  Informed Consent: I have reviewed the patients History and Physical, chart, labs and discussed the procedure including the risks, benefits and alternatives for the proposed anesthesia with the patient or authorized representative who has indicated his/her understanding and acceptance.     Dental advisory  given  Plan Discussed with: CRNA  Anesthesia Plan Comments:         Anesthesia Quick Evaluation

## 2018-10-04 NOTE — Transfer of Care (Signed)
Immediate Anesthesia Transfer of Care Note  Patient: [name]  Procedure(s) Performed: LAPAROSCOPIC BILATERAL INGUINAL HERNIA REPAIR WITH MESH INCISON OF CYST MASS RIGHT ROUND LIGAMENT WITH TAP BLOCK (Right Abdomen)  Patient Location: PACU  Anesthesia Type:General  Level of Consciousness: awake, alert  and oriented  Airway & Oxygen Therapy: Patient Spontanous Breathing and Patient connected to face mask oxygen  Post-op Assessment: Report given to RN and Post -op Vital signs reviewed and stable  Post vital signs: Reviewed and stable  Last Vitals:  Vitals Value Taken Time  BP    Temp    Pulse 74 10/04/2018 12:34 PM  Resp 11 10/04/2018 12:34 PM  SpO2 100 % 10/04/2018 12:34 PM  Vitals shown include unvalidated device data.  Last Pain:  Vitals:   10/04/18 0800  TempSrc: Oral  PainSc: 0-No pain      Patients Stated Pain Goal: 2 (10/04/18 0800)  Complications: No apparent anesthesia complications

## 2018-10-04 NOTE — Anesthesia Procedure Notes (Signed)
Procedure Name: Intubation Date/Time: 10/04/2018 9:47 AM Performed by: Clyde Lundborg, RN Pre-anesthesia Checklist: Patient identified, Emergency Drugs available, Suction available, Patient being monitored and Timeout performed Patient Re-evaluated:Patient Re-evaluated prior to induction Oxygen Delivery Method: Circle system utilized Preoxygenation: Pre-oxygenation with 100% oxygen Induction Type: IV induction Ventilation: Mask ventilation without difficulty and Oral airway inserted - appropriate to patient size Laryngoscope Size: Mac and 3 Grade View: Grade I Tube type: Oral Tube size: 7.0 mm Number of attempts: 1 Airway Equipment and Method: Stylet Placement Confirmation: ETT inserted through vocal cords under direct vision,  positive ETCO2 and breath sounds checked- equal and bilateral Secured at: 21 cm Tube secured with: Tape Dental Injury: Teeth and Oropharynx as per pre-operative assessment

## 2018-10-04 NOTE — Interval H&P Note (Signed)
History and Physical Interval Note:  10/04/2018 9:24 AM  [name]  has presented today for surgery, with the diagnosis of Hernias in right and possibly left groins  The various methods of treatment have been discussed with the patient and family. After consideration of risks, benefits and other options for treatment, the patient has consented to  Procedure(s): LAPAROSCOPIC RIGHT POSSIBLE LEFT  INGUINAL HERNIA REPAIR WITH MESH ERAS PATHWAY (Right) as a surgical intervention .  The patient's history has been reviewed, patient examined, no change in status, stable for surgery.  I have reviewed the patient's chart and labs.  Questions were answered to the patient's satisfaction.    I have re-reviewed the the patient's records, history, medications, and allergies.  I have re-examined the patient.  I again discussed intraoperative plans and goals of post-operative recovery.  The patient agrees to proceed.  [name]  10-19-1965 [id number]  Patient Care Team: Mirna Mires, MD as PCP - General (Family Medicine) Griselda Miner, MD as Consulting Physician (General Surgery) Karie Soda, MD as Consulting Physician (General Surgery)  Patient Active Problem List   Diagnosis Date Noted  . Right inguinal hernia 10/04/2018  . Right groin pain 09/18/2012  . Abnormal cervical Papanicolaou smear 08/19/2003    Past Medical History:  Diagnosis Date  . Abnormal pap   . Cervicitis   . Endometrial polyp   . Hypertension   . Ovarian cyst, right     Past Surgical History:  Procedure Laterality Date  . CESAREAN SECTION  05/12/1998  . CESAREAN SECTION  11/04/1999  . CESAREAN SECTION  02/09/2006  . COLPOSCOPY  10/13/03    Social History   Socioeconomic History  . Marital status: Married    Spouse name: Not on file  . Number of children: Not on file  . Years of education: Not on file  . Highest education level: Not on file  Occupational History  . Not on file  Social Needs   . Financial resource strain: Not on file  . Food insecurity:    Worry: Not on file    Inability: Not on file  . Transportation needs:    Medical: Not on file    Non-medical: Not on file  Tobacco Use  . Smoking status: Never Smoker  . Smokeless tobacco: Never Used  Substance and Sexual Activity  . Alcohol use: No  . Drug use: No  . Sexual activity: Yes    Birth control/protection: None  Lifestyle  . Physical activity:    Days per week: Not on file    Minutes per session: Not on file  . Stress: Not on file  Relationships  . Social connections:    Talks on phone: Not on file    Gets together: Not on file    Attends religious service: Not on file    Active member of club or organization: Not on file    Attends meetings of clubs or organizations: Not on file    Relationship status: Not on file  . Intimate partner violence:    Fear of current or ex partner: Not on file    Emotionally abused: Not on file    Physically abused: Not on file    Forced sexual activity: Not on file  Other Topics Concern  . Not on file  Social History Narrative  . Not on file    Family History  Problem Relation Age of Onset  . Congestive Heart Failure Mother   . Breast cancer  Sister   . Colon cancer Paternal Aunt     Medications Prior to Admission  Medication Sig Dispense Refill Last Dose  . amLODipine (NORVASC) 5 MG tablet Take 5 mg by mouth daily.   10/04/2018 at 0600  . aspirin EC 81 MG tablet Take 81 mg by mouth daily.   09/26/2018  . cholecalciferol (VITAMIN D3) 25 MCG (1000 UT) tablet Take 1,000 Units by mouth daily.   10/02/2018  . losartan (COZAAR) 100 MG tablet Take 100 mg by mouth daily.    10/03/2018 at 0800  . naproxen (NAPROSYN) 500 MG tablet Take 500 mg by mouth daily as needed for moderate pain.   09/26/2018    Current Facility-Administered Medications  Medication Dose Route Frequency Provider Last Rate Last Dose  . bupivacaine liposome (EXPAREL) 1.3 % injection 266 mg  20 mL  Infiltration On Call to OR Karie Soda, MD      . ceFAZolin (ANCEF) IVPB 2g/100 mL premix  2 g Intravenous On Call to OR Karie Soda, MD      . Chlorhexidine Gluconate Cloth 2 % PADS 6 each  6 each Topical Once Karie Soda, MD       And  . Chlorhexidine Gluconate Cloth 2 % PADS 6 each  6 each Topical Once Karie Soda, MD      . lactated ringers infusion   Intravenous Continuous Marcene Duos, MD         No Known Allergies  BP 129/80   Pulse 82   Temp 99.1 F (37.3 C) (Oral)   Resp 16   LMP 08/16/2015   SpO2 100%   Labs: No results found for this or any previous visit (from the past 48 hour(s)).  Imaging / Studies: No results found.   [name], M.D., F.A.C.S. Gastrointestinal and Minimally Invasive Surgery [facility] [address] [place], [place] [address] [phone] Main / Paging  10/04/2018 9:24 AM    [name]

## 2018-10-05 ENCOUNTER — Encounter (HOSPITAL_COMMUNITY): Payer: Self-pay | Admitting: Surgery

## 2018-10-05 DIAGNOSIS — I10 Essential (primary) hypertension: Secondary | ICD-10-CM | POA: Diagnosis not present

## 2018-10-05 DIAGNOSIS — N9489 Other specified conditions associated with female genital organs and menstrual cycle: Secondary | ICD-10-CM | POA: Diagnosis not present

## 2018-10-05 DIAGNOSIS — Z79899 Other long term (current) drug therapy: Secondary | ICD-10-CM | POA: Diagnosis not present

## 2018-10-05 DIAGNOSIS — K4020 Bilateral inguinal hernia, without obstruction or gangrene, not specified as recurrent: Secondary | ICD-10-CM | POA: Diagnosis not present

## 2018-10-05 LAB — POTASSIUM: POTASSIUM: 4.2 mmol/L (ref 3.5–5.1)

## 2018-10-05 LAB — HEMOGLOBIN: Hemoglobin: 7.1 g/dL — ABNORMAL LOW (ref 12.0–15.0)

## 2018-10-05 LAB — CREATININE, SERUM
Creatinine, Ser: 0.5 mg/dL (ref 0.44–1.00)
GFR calc Af Amer: >60 mL/min (ref >60)
GFR calc non Af Amer: >60 mL/min (ref >60)

## 2018-10-05 MED ORDER — SODIUM CHLORIDE 0.9% FLUSH: 3.0000 mL | INTRAVENOUS | Status: DC | PRN

## 2018-10-05 MED ORDER — SODIUM CHLORIDE 0.9% FLUSH: 3.0000 mL | Freq: Two times a day (BID) | INTRAVENOUS | Status: DC

## 2018-10-05 MED ORDER — SODIUM CHLORIDE 0.9 % IV BOLUS: 1000.0000 mL | Freq: Three times a day (TID) | INTRAVENOUS | Status: DC | PRN

## 2018-10-05 MED ORDER — SODIUM CHLORIDE 0.9 % IV SOLN: 250.0000 mL | INTRAVENOUS | Status: DC | PRN

## 2018-10-05 NOTE — Plan of Care (Signed)
Patient lying in bed; has been up to bathroom and ambulating in hall already this morning. No complaints of pain voiced at this time. Will continue to monitor.

## 2018-10-05 NOTE — Progress Notes (Signed)
[name] [id number] 09-03-1965  CARE TEAM:  PCP: Mirna Mires, MD  Outpatient Care Team: Patient Care Team: Mirna Mires, MD as PCP - General (Family Medicine) Griselda Miner, MD as Consulting Physician (General Surgery) Karie Soda, MD as Consulting Physician (General Surgery)  Inpatient Treatment Team: Treatment Team: Attending Provider: Karie Soda, MD; Technician: Manon Hilding, NT; Registered Nurse: Clarita Leber, RN; Registered Nurse: Quentin Cornwall, RN   Problem List:   Principal Problem:   Bilateral inguinal herniae s/p lap repair w mesh 10/04/2018 Active Problems:   Acquired cyst of canal of [place]uck s/p right round ligament excision 10/04/2018   1 Day Post-Op  10/04/2018  Procedure(s): LAPAROSCOPIC BILATERAL INGUINAL HERNIA REPAIR WITH MESH INCISON OF CYST MASS RIGHT ROUND LIGAMENT WITH TAP BLOCK    Assessment  Stable  [facility] Stay = 0 days)  Plan:  -[place].  Check orthostatics  Regular diet.  Question of mild hip flexion weakness on the right side.  Seems close to normal to me and was walking well in the hallways just an hour ago.  Should improve with time.  Make sure she can ambulate well.  Otherwise, should be able to leave home  VTE prophylaxis- SCDs, etc  mobilize as tolerated to help recovery  20 minutes spent in review, evaluation, examination, counseling, and coordination of care.  More than 50% of that time was spent in counseling.  10/05/2018    Subjective: (Chief complaint)  Little sleepy and sedated last night but feels better this morning.  IV fluid bolus last night.  Not this morning.  Walked in the hallways this morning.  Not lightheaded or dizzy.  Urinating.  Feels like her right thigh is not as strong as the left but mild at the most.  Denies much pain or discomfort.  Husband in room.  Feels good and wants to go home.    Objective:  Vital signs:  Vitals:   10/04/18 2010 10/04/18 2306  10/05/18 0254 10/05/18 0636  BP: (!) 109/57 108/71 106/68 112/71  Pulse: 83 73 70 75  Resp: 18 18 18 18   Temp: 98.4 F (36.9 C) 98.8 F (37.1 C) 99.1 F (37.3 C) 99.1 F (37.3 C)  TempSrc: Oral Oral Oral Oral  SpO2: 100% 100% 100% 100%  Weight:      Height:        Last BM Date: 10/04/18(before surgery)  Intake/Output   Yesterday:  02/20 0701 - 02/21 0700 In: 4302.6 [P.O.:590; I.V.:1862; IV Piggyback:1850.5] Out: 950 [Urine:200; Blood:750] This shift:  No intake/output data recorded.  Bowel function:  Flatus: YES  BM:  No  Drain: (No drain)   Physical Exam:  General: Pt awake/alert/oriented x4 in no acute distress Eyes: PERRL, normal EOM.  Sclera clear.  No icterus Neuro: CN II-XII intact w/o focal sensory/motor deficits. Lymph: No head/neck/groin lymphadenopathy Psych:  No delerium/psychosis/paranoia HENT: Normocephalic, Mucus membranes moist.  No thrush Neck: Supple, No tracheal deviation Chest: No chest wall pain w good excursion CV:  Pulses intact.  Regular rhythm MS: Normal AROM mjr joints.  No obvious deformity.  Lifts right thigh rather well but not as aggressively as the left.  Standing and walking fine.  Buckling or weakness.  Abdomen: Soft.  Nondistended.  Nontender.  No evidence of peritonitis.  No incarcerated hernias.  Mild right groin discomfort with no ecchymosis or hematoma.  Ext:  No deformity.  No mjr edema.  No cyanosis Skin: No petechiae / purpura  Results:   Labs: Results  for orders placed or performed during the hospital encounter of 10/04/18 (from the past 48 hour(s))  Type and screen [facility]     Status: None   Collection Time: 10/04/18 11:20 AM  Result Value Ref Range   ABO/RH(D) O NEG    Antibody Screen NEG    Sample Expiration      10/07/2018 Performed at [facility], [address] [place], [place] [address]   ABO/Rh     Status: None   Collection Time: 10/04/18 11:20 AM  Result  Value Ref Range   ABO/RH(D)      [place] NEG Performed at [facility], [address] [place], [place] [address]   Hemoglobin     Status: Abnormal   Collection Time: 10/05/18  4:48 AM  Result Value Ref Range   Hemoglobin 7.1 (L) 12.0 - 15.0 g/dL    Comment: Performed at [facility], [address], [place], [place] [address]  Creatinine, serum     Status: None   Collection Time: 10/05/18  4:48 AM  Result Value Ref Range   Creatinine, Ser 0.50 0.44 - 1.00 mg/dL   GFR calc non Af Amer >60 >60 mL/min   GFR calc Af Amer >60 >60 mL/min    Comment: Performed at [facility], [address], [place], [place] [address]  Potassium     Status: None   Collection Time: 10/05/18  4:48 AM  Result Value Ref Range   Potassium 4.2 3.5 - 5.1 mmol/L    Comment: Performed at [facility], [address], [place], [place] [address]    Imaging / Studies: No results found.  Medications / Allergies: per chart  Antibiotics: Anti-infectives (From admission, onward)   Start     Dose/Rate Route Frequency Ordered Stop   10/04/18 0745  ceFAZolin (ANCEF) IVPB 2g/100 mL premix     2 g 200 mL/hr over 30 Minutes Intravenous On call to O.R. 10/04/18 2060 10/04/18 0959        Note: Portions of this report may have been transcribed using voice recognition software. Every effort was made to ensure accuracy; however, inadvertent computerized transcription errors may be present.   Any transcriptional errors that result from this process are unintentional.     [name], MD, FACS, MASCRS Gastrointestinal and Minimally Invasive Surgery    [address] [place], [place] [address] [phone] Main / Paging [phone] Fax

## 2018-10-05 NOTE — Discharge Summary (Signed)
Physician Discharge Summary    Patient ID: [name] MRN: [id number] DOB/AGE: 1966/04/25  53 y.o.  Patient Care Team: Mirna Mires, MD as PCP - General (Family Medicine) Griselda Miner, MD as Consulting Physician (General Surgery) Karie Soda, MD as Consulting Physician (General Surgery)  Admit date: 10/04/2018  Discharge date: 10/05/2018  Hospital Stay = 0 days    Discharge Diagnoses:  Principal Problem:   Bilateral inguinal herniae s/p lap repair w mesh 10/04/2018 Active Problems:   Acquired cyst of canal of Nuck s/p right round ligament excision 10/04/2018   1 Day Post-Op  10/04/2018  POST-OPERATIVE DIAGNOSIS:   Hernias in right and possibly left groins  SURGERY:  10/04/2018  Procedure(s): LAPAROSCOPIC BILATERAL INGUINAL HERNIA REPAIR WITH MESH INCISON OF CYST MASS RIGHT ROUND LIGAMENT WITH TAP BLOCK  SURGEON:    Surgeon(s): Karie Soda, MD  Consults: None  Hospital Course:   The patient underwent the surgery above.  Postoperatively, the patient gradually mobilized and advanced to a solid diet.  Pain and other symptoms were treated aggressively.    By the time of discharge, the patient was walking well the hallways, eating food, having flatus.  Pain was well-controlled on an oral medications.  Hgb 7.1 but aysmptomatic, walking in hallways.  Based on meeting discharge criteria and continuing to recover, I felt it was safe for the patient to be discharged from the hospital to further recover with close followup. Postoperative recommendations were discussed in detail.  They are written as well.  Discharged Condition: good  Discharge Exam: Blood pressure 112/71, pulse 75, temperature 99.1 F (37.3 C), temperature source Oral, resp. rate 18, height 5\' 6"  (1.676 m), weight 74 kg, last menstrual period 08/16/2015, SpO2 100 %.  General: Pt awake/alert/oriented x4 in No acute distress Eyes: PERRL, normal EOM.  Sclera clear.  No icterus Neuro: CN II-XII  intact w/o focal sensory/motor deficits. Lymph: No head/neck/groin lymphadenopathy Psych:  No delerium/psychosis/paranoia HENT: Normocephalic, Mucus membranes moist.  No thrush Neck: Supple, No tracheal deviation Chest: No chest wall pain w good excursion CV:  Pulses intact.  Regular rhythm MS: Normal AROM mjr joints.  No obvious deformity Abdomen: Soft.  Nondistended.  Mildly tender at right right.  No hematoma/ecchymosis.  No evidence of peritonitis.  No incarcerated hernias. GU:  No recurrent inguinal hernias.  No hematoma/ecchymosis Ext:  SCDs BLE.  No mjr edema.  No cyanosis Skin: No petechiae / purpura   Disposition:   Follow-up Information    Karie Soda, MD. Schedule an appointment as soon as possible for a visit in 3 weeks.   Specialty:  General Surgery Why:  To follow up after your operation Contact information: [address] [place] [address] [phone]           Discharge disposition: 01-Home or Self Care       Discharge Instructions    Call MD for:   Complete by:  As directed    FEVER >101.5 F (Temperatures <101.65F occasionally happen and are not significant)   Call MD for:  extreme fatigue   Complete by:  As directed    Call MD for:  persistant dizziness or light-headedness   Complete by:  As directed    Call MD for:  persistant nausea and vomiting   Complete by:  As directed    Call MD for:  redness, tenderness, or signs of infection (pain, swelling, redness, odor or green/yellow discharge around incision site)   Complete by:  As  directed    Call MD for:  severe uncontrolled pain   Complete by:  As directed    Diet - low sodium heart healthy   Complete by:  As directed    Follow a light diet the first few days at home.  Start with a bland diet such as soups, liquids, starchy foods, low fat foods, etc.   If you feel full, bloated, or constipated, stay on a full liquid or pureed/blenderized diet for a few days until you feel  better and no longer constipated. Gradually get back to a regular solid diet.  Avoid fast food or heavy meals the first week as you are more likely to get nauseated.   Discharge instructions   Complete by:  As directed    One the day of your discharge from the hospital (or the next business weekday), please call [facility] to set up or confirm an appointment to see your surgeon in the office for a follow-up appointment.  Usually it is 2-3 weeks after your surgery.  Other concerns If you are not getting better after two weeks or are noticing you are getting worse, contact our office [phone] for further advice.  We may need to adjust your medications, re-evaluate you in the office, send you to the emergency room, or see what other things we can do to help. The clinic staff is available to answer your questions during regular business hours (8:30am-5pm).  Please Angelica't hesitate to call and ask to speak to one of our nurses for clinical concerns.    A surgeon from [facility] is always on call at the hospitals 24 hours/day If you have a medical emergency, go to the nearest emergency room or call 911.   Driving Restrictions   Complete by:  As directed    You may drive when you are no longer taking prescription pain medication, you can comfortably wear a seatbelt, and you can safely maneuver your car and apply brakes.   Increase activity slowly   Complete by:  As directed    Lifting restrictions   Complete by:  As directed    You may resume regular (light) daily activities beginning the next day-such as daily self-care, walking, climbing stairs-gradually increasing activities as tolerated.   If you can walk 30 minutes without difficulty, it is safe to try more intense activity such as jogging, treadmill, bicycling, low-impact aerobics, swimming, etc. Save the most intensive and strenuous activity for last such as sit-ups, heavy lifting, contact sports, etc   Refrain  from any heavy lifting or straining until you are off narcotics for pain control.   DO NOT PUSH THROUGH PAIN.   Let pain be your guide: If it hurts to do something, Angelica't do it.   Pain is your body warning you to avoid that activity for another week until the pain goes down.   May shower / Bathe   Complete by:  As directed    Wash / shower every day.  You may shower over the dressings as they are waterproof.  Continue to shower over incision(s) after the dressing is off.   May walk up steps   Complete by:  As directed    Remove dressing in 72 hours   Complete by:  As directed    Remove your waterproof dressings, skin tapes, and other bandages 5 days after surgery.    You may leave the incision(s) open to air.   You may replace a dressing/Band-Aid  to cover the incision for comfort if you wish.   Sexual Activity Restrictions   Complete by:  As directed    You may have sexual intercourse when it is comfortable. If it hurts to do something, stop.      Allergies as of 10/05/2018   No Known Allergies     Medication List    TAKE these medications   amLODipine 5 MG tablet Commonly known as:  NORVASC Take 5 mg by mouth daily.   aspirin EC 81 MG tablet Take 81 mg by mouth daily.   cholecalciferol 25 MCG (1000 UT) tablet Commonly known as:  VITAMIN D3 Take 1,000 Units by mouth daily.   losartan 100 MG tablet Commonly known as:  COZAAR Take 100 mg by mouth daily.   naproxen 500 MG tablet Commonly known as:  NAPROSYN Take 1 tablet (500 mg total) by mouth every 12 (twelve) hours as needed for moderate pain or headache. What changed:    when to take this  reasons to take this   traMADol 50 MG tablet Commonly known as:  ULTRAM Take 1-2 tablets (50-100 mg total) by mouth every 6 (six) hours as needed for moderate pain or severe pain.       Significant Diagnostic Studies:  Results for orders placed or performed during the hospital encounter of 10/04/18 (from the past 72  hour(s))  Type and screen [facility]     Status: None   Collection Time: 10/04/18 11:20 AM  Result Value Ref Range   ABO/RH(D) O NEG    Antibody Screen NEG    Sample Expiration      10/07/2018 Performed at [facility], [address], [place], [place] [address]   ABO/Rh     Status: None   Collection Time: 10/04/18 11:20 AM  Result Value Ref Range   ABO/RH(D)      [place] NEG Performed at [facility], [address], [place], [place] [address]   Hemoglobin     Status: Abnormal   Collection Time: 10/05/18  4:48 AM  Result Value Ref Range   Hemoglobin 7.1 (L) 12.0 - 15.0 g/dL    Comment: Performed at [facility], [address] [place], [place] [address]  Creatinine, serum     Status: None   Collection Time: 10/05/18  4:48 AM  Result Value Ref Range   Creatinine, Ser 0.50 0.44 - 1.00 mg/dL   GFR calc non Af Amer >60 >60 mL/min   GFR calc Af Amer >60 >60 mL/min    Comment: Performed at [facility], [address] [place], [place] [address]  Potassium     Status: None   Collection Time: 10/05/18  4:48 AM  Result Value Ref Range   Potassium 4.2 3.5 - 5.1 mmol/L    Comment: Performed at [facility], [address], [place], [place] [address]    No results found.  Past Medical History:  Diagnosis Date  . Abnormal pap   . Cervicitis   . Endometrial polyp   . Hypertension   . Ovarian cyst, right     Past Surgical History:  Procedure Laterality Date  . CESAREAN SECTION  05/12/1998  . CESAREAN SECTION  11/04/1999  . CESAREAN SECTION  02/09/2006  . COLPOSCOPY  10/13/03    Social History   Socioeconomic History  . Marital status: Married    Spouse name: Not on file  . Number of children: Not  on file  . Years of education: Not on file  . Highest education level: Not on file  Occupational History  . Not on file  Social Needs  . Financial resource strain:  Not on file  . Food insecurity:    Worry: Not on file    Inability: Not on file  . Transportation needs:    Medical: Not on file    Non-medical: Not on file  Tobacco Use  . Smoking status: Never Smoker  . Smokeless tobacco: Never Used  Substance and Sexual Activity  . Alcohol use: No  . Drug use: No  . Sexual activity: Yes    Birth control/protection: None  Lifestyle  . Physical activity:    Days per week: Not on file    Minutes per session: Not on file  . Stress: Not on file  Relationships  . Social connections:    Talks on phone: Not on file    Gets together: Not on file    Attends religious service: Not on file    Active member of club or organization: Not on file    Attends meetings of clubs or organizations: Not on file    Relationship status: Not on file  . Intimate partner violence:    Fear of current or ex partner: Not on file    Emotionally abused: Not on file    Physically abused: Not on file    Forced sexual activity: Not on file  Other Topics Concern  . Not on file  Social History Narrative  . Not on file    Family History  Problem Relation Age of Onset  . Congestive Heart Failure Mother   . Breast cancer Sister   . Colon cancer Paternal Aunt     Current Facility-Administered Medications  Medication Dose Route Frequency Provider Last Rate Last Dose  . 0.9 %  sodium chloride infusion  250 mL Intravenous PRN Karie Soda, MD      . acetaminophen (TYLENOL) tablet 1,000 mg  1,000 mg Oral Trixie Deis, MD   1,000 mg at 10/05/18 0545  . alum & mag hydroxide-simeth (MAALOX/MYLANTA) 200-200-20 MG/5ML suspension 30 mL  30 mL Oral Q6H PRN Karie Soda, MD      . bisacodyl (DULCOLAX) suppository 10 mg  10 mg Rectal Daily PRN Karie Soda, MD      . diphenhydrAMINE (BENADRYL) 12.5 MG/5ML elixir 12.5 mg  12.5 mg Oral Q6H PRN Karie Soda, MD       Or  . diphenhydrAMINE (BENADRYL) injection 12.5 mg  12.5 mg Intravenous Q6H PRN Karie Soda, MD      .  enalaprilat (VASOTEC) injection 0.625-1.25 mg  0.625-1.25 mg Intravenous Q6H PRN Karie Soda, MD      . gabapentin (NEURONTIN) capsule 300 mg  300 mg Oral BID Karie Soda, MD   300 mg at 10/04/18 2157  . guaiFENesin-dextromethorphan (ROBITUSSIN DM) 100-10 MG/5ML syrup 10 mL  10 mL Oral Q4H PRN Karie Soda, MD      . hydrALAZINE (APRESOLINE) injection 5-20 mg  5-20 mg Intravenous Q4H PRN Karie Soda, MD      . hydrocortisone (ANUSOL-HC) 2.5 % rectal cream 1 application  1 application Topical QID PRN Karie Soda, MD      . hydrocortisone cream 1 % 1 application  1 application Topical TID PRN Karie Soda, MD      . HYDROmorphone (DILAUDID) injection 0.5-2 mg  0.5-2 mg Intravenous Q2H PRN Karie Soda, MD   1 mg at 10/04/18  1440  . lactated ringers bolus 1,000 mL  1,000 mL Intravenous Q8H PRN Karie SodaGross, Nichalos Brenton, MD 983.6 mL/hr at 10/04/18 1901 1,000 mL at 10/04/18 1901  . lactated ringers infusion 1,000 mL  1,000 mL Intravenous Q8H PRN Karie SodaGross, Benigno Check, MD      . lip balm (CARMEX) ointment 1 application  1 application Topical BID Karie SodaGross, Skylyn Slezak, MD   1 application at 10/04/18 2158  . magic mouthwash  15 mL Oral QID PRN Karie SodaGross, Taquita Demby, MD      . menthol-cetylpyridinium (CEPACOL) lozenge 3 mg  1 lozenge Oral PRN Karie SodaGross, Rheba Diamond, MD      . methocarbamol (ROBAXIN) tablet 750 mg  750 mg Oral Q6H PRN Karie SodaGross, Shukri Nistler, MD      . metoprolol tartrate (LOPRESSOR) injection 5 mg  5 mg Intravenous Q6H PRN Karie SodaGross, Emillee Talsma, MD      . ondansetron (ZOFRAN-ODT) disintegrating tablet 4 mg  4 mg Oral Q6H PRN Karie SodaGross, Doranne Schmutz, MD       Or  . ondansetron [facility](ZOFRAN) injection 4 mg  4 mg Intravenous Q6H PRN Karie SodaGross, Nakita Santerre, MD      . oxyCODONE (Oxy IR/ROXICODONE) immediate release tablet 5-10 mg  5-10 mg Oral Q4H PRN Karie SodaGross, Jinny Sweetland, MD   5 mg at 10/04/18 2157  . phenol (CHLORASEPTIC) mouth spray 1-2 spray  1-2 spray Mouth/Throat PRN Karie SodaGross, Bishop Vanderwerf, MD      . polyethylene glycol (MIRALAX / GLYCOLAX) packet 17 g  17 g Oral Daily PRN  Karie SodaGross, Vasily Fedewa, MD      . prochlorperazine (COMPAZINE) tablet 10 mg  10 mg Oral Q6H PRN Karie SodaGross, Starlin Steib, MD       Or  . prochlorperazine (COMPAZINE) injection 5-10 mg  5-10 mg Intravenous Q6H PRN Karie SodaGross, Symphonie Schneiderman, MD      . psyllium (HYDROCIL/METAMUCIL) packet 1 packet  1 packet Oral BID Karie SodaGross, Sergei Delo, MD   1 packet at 10/04/18 2157  . simethicone (MYLICON) chewable tablet 40 mg  40 mg Oral Q6H PRN Karie SodaGross, Masaji Billups, MD      . sodium chloride 0.9 % bolus 1,000 mL  1,000 mL Intravenous Q8H PRN Shalay Carder, Viviann SpareSteven, MD      . sodium chloride flush (NS) 0.9 % injection 3 mL  3 mL Intravenous Catha GosselinQ12H Korde Jeppsen, MD      . sodium chloride flush (NS) 0.9 % injection 3 mL  3 mL Intravenous PRN Karie SodaGross, Ark Agrusa, MD      . zolpidem (AMBIEN) tablet 5 mg  5 mg Oral QHS PRN Karie SodaGross, Denver Harder, MD         No Known Allergies  Signed: Lorenso CourierSteven C Thai Hemrick C. Audriana Aldama, MD, FACS, MASCRS Gastrointestinal and Minimally Invasive Surgery    [address] [place], [place] [id number] [phone] Main / Paging [phone] Fax   10/05/2018, 7:06 AM

## 2018-10-09 NOTE — Anesthesia Postprocedure Evaluation (Signed)
Anesthesia Post Note  Patient: [name]  Procedure(s) Performed: LAPAROSCOPIC BILATERAL INGUINAL HERNIA REPAIR WITH MESH INCISON OF CYST MASS RIGHT ROUND LIGAMENT WITH TAP BLOCK (Right Abdomen)     Patient location during evaluation: PACU Anesthesia Type: General Level of consciousness: awake and alert Pain management: pain level controlled Vital Signs Assessment: post-procedure vital signs reviewed and stable Respiratory status: spontaneous breathing, nonlabored ventilation, respiratory function stable and patient connected to nasal cannula oxygen Cardiovascular status: blood pressure returned to baseline and stable Postop Assessment: no apparent nausea or vomiting Anesthetic complications: no    Last Vitals:  Vitals:   10/05/18 0819 10/05/18 0820  BP: (!) 100/58 102/73  Pulse: (!) 106 (!) 105  Resp:    Temp:    SpO2: 100% 100%    Last Pain:  Vitals:   10/05/18 0849  TempSrc:   PainSc: 0-No pain                 [name]

## 2018-10-12 ENCOUNTER — Emergency Department (HOSPITAL_COMMUNITY): Payer: 59

## 2018-10-12 ENCOUNTER — Encounter (HOSPITAL_COMMUNITY): Payer: Self-pay

## 2018-10-12 ENCOUNTER — Observation Stay (HOSPITAL_COMMUNITY)
Admission: EM | Admit: 2018-10-12 | Discharge: 2018-10-14 | Disposition: A | Discharge status: Home / Self Care | Payer: 59 | Attending: Surgery | Admitting: Surgery

## 2018-10-12 ENCOUNTER — Other Ambulatory Visit: Payer: Self-pay

## 2018-10-12 DIAGNOSIS — J189 Pneumonia, unspecified organism: Secondary | ICD-10-CM | POA: Diagnosis not present

## 2018-10-12 DIAGNOSIS — R509 Fever, unspecified: Secondary | ICD-10-CM | POA: Diagnosis not present

## 2018-10-12 DIAGNOSIS — I10 Essential (primary) hypertension: Secondary | ICD-10-CM | POA: Diagnosis not present

## 2018-10-12 DIAGNOSIS — R531 Weakness: Secondary | ICD-10-CM | POA: Diagnosis not present

## 2018-10-12 DIAGNOSIS — D649 Anemia, unspecified: Secondary | ICD-10-CM

## 2018-10-12 DIAGNOSIS — N9489 Other specified conditions associated with female genital organs and menstrual cycle: Secondary | ICD-10-CM | POA: Diagnosis present

## 2018-10-12 DIAGNOSIS — Z9889 Other specified postprocedural states: Secondary | ICD-10-CM | POA: Diagnosis not present

## 2018-10-12 DIAGNOSIS — G5781 Other specified mononeuropathies of right lower limb: Secondary | ICD-10-CM

## 2018-10-12 DIAGNOSIS — K4020 Bilateral inguinal hernia, without obstruction or gangrene, not specified as recurrent: Secondary | ICD-10-CM | POA: Diagnosis present

## 2018-10-12 DIAGNOSIS — R000 Tachycardia, unspecified: Secondary | ICD-10-CM | POA: Diagnosis not present

## 2018-10-12 LAB — URINALYSIS, ROUTINE W REFLEX MICROSCOPIC
Bilirubin Urine: NEGATIVE
Glucose, UA: NEGATIVE mg/dL
Ketones, ur: NEGATIVE mg/dL
Nitrite: NEGATIVE
PH: 5 (ref 5.0–8.0)
Protein, ur: 30 mg/dL — AB
Specific Gravity, Urine: 1.011 (ref 1.005–1.030)

## 2018-10-12 LAB — CBC WITH DIFFERENTIAL/PLATELET
Abs Immature Granulocytes: 0.03 10*3/uL (ref 0.00–0.07)
BASOS ABS: 0 10*3/uL (ref 0.0–0.1)
Basophils Relative: 0 %
Eosinophils Absolute: 0 10*3/uL (ref 0.0–0.5)
Eosinophils Relative: 1 %
HCT: 22.2 % — ABNORMAL LOW (ref 36.0–46.0)
Hemoglobin: 6.6 g/dL — CL (ref 12.0–15.0)
Immature Granulocytes: 1 %
LYMPHS PCT: 6 %
Lymphs Abs: 0.4 10*3/uL — ABNORMAL LOW (ref 0.7–4.0)
MCH: 26.4 pg (ref 26.0–34.0)
MCHC: 29.7 g/dL — ABNORMAL LOW (ref 30.0–36.0)
MCV: 88.8 fL (ref 80.0–100.0)
Monocytes Absolute: 0.5 10*3/uL (ref 0.1–1.0)
Monocytes Relative: 8 %
Neutro Abs: 5.5 10*3/uL (ref 1.7–7.7)
Neutrophils Relative %: 84 %
PLATELETS: 240 10*3/uL (ref 150–400)
RBC: 2.5 MIL/uL — AB (ref 3.87–5.11)
RDW: 14.2 % (ref 11.5–15.5)
WBC: 6.5 10*3/uL (ref 4.0–10.5)
nRBC: 0 % (ref 0.0–0.2)

## 2018-10-12 LAB — COMPREHENSIVE METABOLIC PANEL
ALT: 27 U/L (ref 0–44)
AST: 26 U/L (ref 15–41)
Albumin: 4 g/dL (ref 3.5–5.0)
Alkaline Phosphatase: 88 U/L (ref 38–126)
Anion gap: 12 (ref 5–15)
BUN: 14 mg/dL (ref 6–20)
CHLORIDE: 95 mmol/L — AB (ref 98–111)
CO2: 26 mmol/L (ref 22–32)
Calcium: 8.7 mg/dL — ABNORMAL LOW (ref 8.9–10.3)
Creatinine, Ser: 0.66 mg/dL (ref 0.44–1.00)
GFR calc Af Amer: >60 mL/min (ref >60)
GFR calc non Af Amer: >60 mL/min (ref >60)
Glucose, Bld: 135 mg/dL — ABNORMAL HIGH (ref 70–99)
Potassium: 3.3 mmol/L — ABNORMAL LOW (ref 3.5–5.1)
Sodium: 133 mmol/L — ABNORMAL LOW (ref 135–145)
Total Bilirubin: 1.8 mg/dL — ABNORMAL HIGH (ref 0.3–1.2)
Total Protein: 8 g/dL (ref 6.5–8.1)

## 2018-10-12 LAB — LIPASE, BLOOD: Lipase: 29 U/L (ref 11–51)

## 2018-10-12 LAB — PREPARE RBC (CROSSMATCH)

## 2018-10-12 LAB — LACTIC ACID, PLASMA
LACTIC ACID, VENOUS: 1.1 mmol/L (ref 0.5–1.9)
Lactic Acid, Venous: 1.4 mmol/L (ref 0.5–1.9)

## 2018-10-12 LAB — MAGNESIUM: Magnesium: 2.2 mg/dL (ref 1.7–2.4)

## 2018-10-12 MED ORDER — ALUM & MAG HYDROXIDE-SIMETH 200-200-20 MG/5ML PO SUSP: 30.0000 mL | Freq: Four times a day (QID) | ORAL | Status: DC | PRN

## 2018-10-12 MED ORDER — FERROUS SULFATE 325 (65 FE) MG PO TABS: 325.0000 mg | ORAL_TABLET | Freq: Two times a day (BID) | ORAL | Status: DC

## 2018-10-12 MED ORDER — GABAPENTIN 300 MG PO CAPS
300.0000 mg | ORAL_CAPSULE | Freq: Every day | ORAL | Status: DC
  Administered 2018-10-12 – 2018-10-13 (×2): 300 mg via ORAL
  Filled 2018-10-12 (×2): qty 1

## 2018-10-12 MED ORDER — ACETAMINOPHEN 500 MG PO TABS
1000.0000 mg | ORAL_TABLET | Freq: Three times a day (TID) | ORAL | Status: DC
  Administered 2018-10-12 – 2018-10-14 (×5): 1000 mg via ORAL
  Filled 2018-10-12 (×5): qty 2

## 2018-10-12 MED ORDER — SODIUM CHLORIDE 0.9 % IV BOLUS (SEPSIS)
1000.0000 mL | Freq: Once | INTRAVENOUS | Status: AC
  Administered 2018-10-12: 1000 mL via INTRAVENOUS

## 2018-10-12 MED ORDER — METRONIDAZOLE IN NACL 5-0.79 MG/ML-% IV SOLN
500.0000 mg | Freq: Once | INTRAVENOUS | Status: AC
  Administered 2018-10-12: 500 mg via INTRAVENOUS
  Filled 2018-10-12: qty 100

## 2018-10-12 MED ORDER — SODIUM CHLORIDE 0.9 % IV BOLUS (SEPSIS): 250.0000 mL | Freq: Once | INTRAVENOUS | Status: DC

## 2018-10-12 MED ORDER — SODIUM CHLORIDE 0.9 % IV BOLUS (SEPSIS): 1000.0000 mL | Freq: Once | INTRAVENOUS | Status: DC

## 2018-10-12 MED ORDER — LIP MEDEX EX OINT
1.0000 "application " | TOPICAL_OINTMENT | Freq: Two times a day (BID) | CUTANEOUS | Status: DC
  Administered 2018-10-12 – 2018-10-14 (×3): 1 via TOPICAL
  Filled 2018-10-12 (×2): qty 7

## 2018-10-12 MED ORDER — ACETAMINOPHEN 650 MG RE SUPP: 650.0000 mg | Freq: Four times a day (QID) | RECTAL | Status: DC | PRN

## 2018-10-12 MED ORDER — SODIUM CHLORIDE 0.9 % IV SOLN
8.0000 mg | Freq: Four times a day (QID) | INTRAVENOUS | Status: DC | PRN
  Filled 2018-10-12: qty 4

## 2018-10-12 MED ORDER — DIPHENHYDRAMINE HCL 50 MG/ML IJ SOLN: 12.5000 mg | Freq: Four times a day (QID) | INTRAMUSCULAR | Status: DC | PRN

## 2018-10-12 MED ORDER — MAGIC MOUTHWASH
15.0000 mL | Freq: Four times a day (QID) | ORAL | Status: DC | PRN
  Filled 2018-10-12: qty 15

## 2018-10-12 MED ORDER — GUAIFENESIN-DM 100-10 MG/5ML PO SYRP: 10.0000 mL | ORAL_SOLUTION | ORAL | Status: DC | PRN

## 2018-10-12 MED ORDER — METHOCARBAMOL 500 MG PO TABS: 1000.0000 mg | ORAL_TABLET | Freq: Four times a day (QID) | ORAL | Status: DC | PRN

## 2018-10-12 MED ORDER — ONDANSETRON HCL 4 MG/2ML IJ SOLN: 4.0000 mg | Freq: Four times a day (QID) | INTRAMUSCULAR | Status: DC | PRN

## 2018-10-12 MED ORDER — PSYLLIUM 95 % PO PACK
1.0000 | PACK | Freq: Two times a day (BID) | ORAL | Status: DC
  Administered 2018-10-13 – 2018-10-14 (×3): 1 via ORAL
  Filled 2018-10-12 (×4): qty 1

## 2018-10-12 MED ORDER — ACETAMINOPHEN 325 MG PO TABS
650.0000 mg | ORAL_TABLET | Freq: Four times a day (QID) | ORAL | Status: DC | PRN
  Administered 2018-10-14: 650 mg via ORAL
  Filled 2018-10-12: qty 2

## 2018-10-12 MED ORDER — VITAMIN C 500 MG PO TABS
500.0000 mg | ORAL_TABLET | Freq: Two times a day (BID) | ORAL | Status: DC
  Administered 2018-10-12 – 2018-10-14 (×4): 500 mg via ORAL
  Filled 2018-10-12 (×4): qty 1

## 2018-10-12 MED ORDER — SIMETHICONE 80 MG PO CHEW: 40.0000 mg | CHEWABLE_TABLET | Freq: Four times a day (QID) | ORAL | Status: DC | PRN

## 2018-10-12 MED ORDER — HYDROCORTISONE 2.5 % RE CREA: 1.0000 "application " | TOPICAL_CREAM | Freq: Four times a day (QID) | RECTAL | Status: DC | PRN

## 2018-10-12 MED ORDER — PROCHLORPERAZINE EDISYLATE 10 MG/2ML IJ SOLN: 5.0000 mg | INTRAMUSCULAR | Status: DC | PRN

## 2018-10-12 MED ORDER — LACTATED RINGERS IV BOLUS: 1000.0000 mL | Freq: Three times a day (TID) | INTRAVENOUS | Status: DC | PRN

## 2018-10-12 MED ORDER — ONDANSETRON 4 MG PO TBDP: 4.0000 mg | ORAL_TABLET | Freq: Four times a day (QID) | ORAL | Status: DC | PRN

## 2018-10-12 MED ORDER — HYDROCORTISONE 1 % EX CREA: 1.0000 "application " | TOPICAL_CREAM | Freq: Three times a day (TID) | CUTANEOUS | Status: DC | PRN

## 2018-10-12 MED ORDER — POLYETHYLENE GLYCOL 3350 17 G PO PACK
17.0000 g | PACK | Freq: Two times a day (BID) | ORAL | Status: DC | PRN
  Filled 2018-10-12: qty 1

## 2018-10-12 MED ORDER — LACTATED RINGERS IV BOLUS
1000.0000 mL | Freq: Once | INTRAVENOUS | Status: AC
  Administered 2018-10-12: 1000 mL via INTRAVENOUS

## 2018-10-12 MED ORDER — SODIUM CHLORIDE 0.9% IV SOLUTION: Freq: Once | INTRAVENOUS | Status: DC

## 2018-10-12 MED ORDER — NAPROXEN 500 MG PO TABS
500.0000 mg | ORAL_TABLET | Freq: Two times a day (BID) | ORAL | Status: DC | PRN
  Administered 2018-10-13 – 2018-10-14 (×2): 500 mg via ORAL
  Filled 2018-10-12 (×2): qty 1

## 2018-10-12 MED ORDER — PHENOL 1.4 % MT LIQD: 1.0000 | OROMUCOSAL | Status: DC | PRN

## 2018-10-12 MED ORDER — TRAMADOL HCL 50 MG PO TABS
50.0000 mg | ORAL_TABLET | Freq: Four times a day (QID) | ORAL | Status: DC | PRN
  Administered 2018-10-12 – 2018-10-14 (×7): 50 mg via ORAL
  Filled 2018-10-12 (×2): qty 1
  Filled 2018-10-12: qty 2
  Filled 2018-10-12 (×2): qty 1
  Filled 2018-10-12: qty 2
  Filled 2018-10-12: qty 1

## 2018-10-12 MED ORDER — POTASSIUM CHLORIDE CRYS ER 20 MEQ PO TBCR
40.0000 meq | EXTENDED_RELEASE_TABLET | Freq: Once | ORAL | Status: AC
  Administered 2018-10-12: 40 meq via ORAL
  Filled 2018-10-12: qty 2

## 2018-10-12 MED ORDER — BISACODYL 10 MG RE SUPP: 10.0000 mg | Freq: Two times a day (BID) | RECTAL | Status: DC | PRN

## 2018-10-12 MED ORDER — METHOCARBAMOL 1000 MG/10ML IJ SOLN
1000.0000 mg | Freq: Four times a day (QID) | INTRAVENOUS | Status: DC | PRN
  Filled 2018-10-12: qty 10

## 2018-10-12 MED ORDER — SODIUM CHLORIDE 0.9 % IV SOLN
2.0000 g | Freq: Once | INTRAVENOUS | Status: AC
  Administered 2018-10-12: 2 g via INTRAVENOUS
  Filled 2018-10-12: qty 20

## 2018-10-12 MED ORDER — MENTHOL 3 MG MT LOZG: 1.0000 | LOZENGE | OROMUCOSAL | Status: DC | PRN

## 2018-10-12 MED ORDER — LACTATED RINGERS IV SOLN
INTRAVENOUS | Status: DC
  Administered 2018-10-12: 21:00:00 via INTRAVENOUS
  Administered 2018-10-13: 100 mL/h via INTRAVENOUS

## 2018-10-12 MED FILL — traMADol HCL 50 MG TABS: 50 | 5 days supply | Qty: 20 | Fill #0

## 2018-10-12 NOTE — ED Notes (Signed)
Patient ambulated to restroom with 1 assist. Urine sample provided.

## 2018-10-12 NOTE — ED Notes (Signed)
Patient requesting pain medication. Explained to patient that medication had not been verified by pharmacy and that I could not administer medication safely. Message sent to pharmacy to verify medication. Patient and family support provided. Will continue to monitor patient.

## 2018-10-12 NOTE — ED Notes (Signed)
Blood consent signed and at bedside. Benefits and risks reviewed with patient.

## 2018-10-12 NOTE — ED Notes (Signed)
Patient ambulated to restroom with 1 assist.

## 2018-10-12 NOTE — ED Triage Notes (Addendum)
Patient states she had a hernia repair on 09/16/08. Patient states she has been having weakness and numbness to the right leg. Patient states she has been having a fever today with T.101.6 at home. Patient took Tylenol at 1350 today.  Patient states her Hgb was 7.1 when she was discharged.
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
## 2018-10-12 NOTE — ED Notes (Signed)
Patient transported to X-ray 

## 2018-10-12 NOTE — ED Provider Notes (Signed)
[facility] Provider Note MRN:  [id number]  Arrival date & time: 10/12/18     Chief Complaint   Fever and Weakness   History of Present Illness   [name] is a 53 y.o. year-old female with a history of hernia repair presenting to the ED with chief complaint of fever and weakness.  Patient explains that she had hernia surgery 8 days ago.  Was recovering well, about 2 days ago began feeling worse, generalized weakness, increased pain to the right lower quadrant, fever up to 101.5 yesterday, 100.8 today.  Denies headache or vision change, no chest pain or shortness of breath, no cough or cold-like symptoms, no dysuria but some blood in the urine this morning.  Patient also endorsing weakness to the right leg, began 2 days ago as well.  This leg also has intermittent numbness, not currently present.  Review of Systems  A complete 10 system review of systems was obtained and all systems are negative except as noted in the HPI and PMH.   Patient's Health History    Past Medical History:  Diagnosis Date  . Abnormal pap   . Cervicitis   . Endometrial polyp   . Hypertension   . Ovarian cyst, right     Past Surgical History:  Procedure Laterality Date  . CESAREAN SECTION  05/12/1998  . CESAREAN SECTION  11/04/1999  . CESAREAN SECTION  02/09/2006  . COLPOSCOPY  10/13/03  . INGUINAL HERNIA REPAIR Right 10/04/2018   Procedure: LAPAROSCOPIC BILATERAL INGUINAL HERNIA REPAIR WITH MESH INCISON OF CYST MASS RIGHT ROUND LIGAMENT WITH TAP BLOCK;  Surgeon: Karie Soda, MD;  Location: [facility];  Service: General;  Laterality: Right;  ERAS PATHWAY    Family History  Problem Relation Age of Onset  . Congestive Heart Failure Mother   . Breast cancer Sister   . Colon cancer Paternal Aunt     Social History   Socioeconomic History  . Marital status: Married    Spouse name: Not on file  . Number of children: Not on file  . Years of education: Not  on file  . Highest education level: Not on file  Occupational History  . Not on file  Social Needs  . Financial resource strain: Not on file  . Food insecurity:    Worry: Not on file    Inability: Not on file  . Transportation needs:    Medical: Not on file    Non-medical: Not on file  Tobacco Use  . Smoking status: Never Smoker  . Smokeless tobacco: Never Used  Substance and Sexual Activity  . Alcohol use: No  . Drug use: No  . Sexual activity: Yes    Birth control/protection: None  Lifestyle  . Physical activity:    Days per week: Not on file    Minutes per session: Not on file  . Stress: Not on file  Relationships  . Social connections:    Talks on phone: Not on file    Gets together: Not on file    Attends religious service: Not on file    Active member of club or organization: Not on file    Attends meetings of clubs or organizations: Not on file    Relationship status: Not on file  . Intimate partner violence:    Fear of current or ex partner: Not on file    Emotionally abused: Not on file    Physically abused: Not on file  Forced sexual activity: Not on file  Other Topics Concern  . Not on file  Social History Narrative  . Not on file     Physical Exam  Vital Signs and Nursing Notes reviewed Vitals:   10/12/18 2000 10/12/18 2047  BP: 118/73 125/72  Pulse: (!) 119 (!) 120  Resp: 18 20  Temp:  98.4 F (36.9 C)  SpO2: 99% 100%    CONSTITUTIONAL: Well-appearing, NAD NEURO:  Alert and oriented x 3, mild decrease strength to the right lower extremity, normal and symmetric sensation EYES:  eyes equal and reactive ENT/NECK:  no LAD, no JVD CARDIO: Tachycardic rate, well-perfused, normal S1 and S2 PULM:  CTAB no wheezing or rhonchi GI/GU:  normal bowel sounds, non-distended, moderate tenderness to palpation to the right lower quadrant, no rebound, no guarding MSK/SPINE:  No gross deformities, no edema SKIN:  no rash, atraumatic PSYCH:  Appropriate  speech and behavior  Diagnostic and Interventional Summary    EKG Interpretation  Date/Time:  Friday October 12 2018 16:50:25 EST Ventricular Rate:  125 PR Interval:    QRS Duration: 71 QT Interval:  422 QTC Calculation: 609 R Axis:   66 Text Interpretation:  Sinus tachycardia LAE, consider biatrial enlargement Borderline T wave abnormalities Prolonged QT interval Confirmed by Kennis Carina [phone]) on 10/12/2018 5:03:31 PM      Labs Reviewed  COMPREHENSIVE METABOLIC PANEL - Abnormal; Notable for the following components:      Result Value   Sodium 133 (*)    Potassium 3.3 (*)    Chloride 95 (*)    Glucose, Bld 135 (*)    Calcium 8.7 (*)    Total Bilirubin 1.8 (*)    All other components within normal limits  CBC WITH DIFFERENTIAL/PLATELET - Abnormal; Notable for the following components:   RBC 2.50 (*)    Hemoglobin 6.6 (*)    HCT 22.2 (*)    MCHC 29.7 (*)    Lymphs Abs 0.4 (*)    All other components within normal limits  URINALYSIS, ROUTINE W REFLEX MICROSCOPIC - Abnormal; Notable for the following components:   APPearance HAZY (*)    Hgb urine dipstick LARGE (*)    Protein, ur 30 (*)    Leukocytes,Ua TRACE (*)    Bacteria, UA RARE (*)    All other components within normal limits  CULTURE, BLOOD (ROUTINE X 2)  CULTURE, BLOOD (ROUTINE X 2)  LACTIC ACID, PLASMA  LACTIC ACID, PLASMA  LIPASE, BLOOD  MAGNESIUM  CBC  MAGNESIUM  POTASSIUM  HIV ANTIBODY (ROUTINE TESTING W REFLEX)  TYPE AND SCREEN  PREPARE RBC (CROSSMATCH)    DG Chest 2 View  Final Result      Medications  0.9 %  sodium chloride infusion (Manually program via Guardrails IV Fluids) (has no administration in time range)  methocarbamol (ROBAXIN) 1,000 mg in dextrose 5 % 50 mL IVPB (has no administration in time range)  methocarbamol (ROBAXIN) tablet 1,000 mg (has no administration in time range)  naproxen (NAPROSYN) tablet 500 mg (has no administration in time range)  acetaminophen (TYLENOL)  tablet 1,000 mg (has no administration in time range)  gabapentin (NEURONTIN) capsule 300 mg (has no administration in time range)  traMADol (ULTRAM) tablet 50-100 mg (50 mg Oral Given 10/12/18 2057)  ondansetron (ZOFRAN) 8 mg in sodium chloride 0.9 % 50 mL IVPB (has no administration in time range)  prochlorperazine (COMPAZINE) injection 5-10 mg (has no administration in time range)  lip balm (CARMEX) ointment 1  application (has no administration in time range)  magic mouthwash (has no administration in time range)  psyllium (HYDROCIL/METAMUCIL) packet 1 packet (has no administration in time range)  polyethylene glycol (MIRALAX / GLYCOLAX) packet 17 g (has no administration in time range)  bisacodyl (DULCOLAX) suppository 10 mg (has no administration in time range)  guaiFENesin-dextromethorphan (ROBITUSSIN DM) 100-10 MG/5ML syrup 10 mL (has no administration in time range)  hydrocortisone (ANUSOL-HC) 2.5 % rectal cream 1 application (has no administration in time range)  alum & mag hydroxide-simeth (MAALOX/MYLANTA) 200-200-20 MG/5ML suspension 30 mL (has no administration in time range)  hydrocortisone cream 1 % 1 application (has no administration in time range)  menthol-cetylpyridinium (CEPACOL) lozenge 3 mg (has no administration in time range)  phenol (CHLORASEPTIC) mouth spray 1-2 spray (has no administration in time range)  diphenhydrAMINE (BENADRYL) injection 12.5-25 mg (has no administration in time range)  vitamin C (ASCORBIC ACID) tablet 500 mg (has no administration in time range)  ferrous sulfate tablet 325 mg (has no administration in time range)  lactated ringers bolus 1,000 mL (has no administration in time range)  lactated ringers infusion (has no administration in time range)  acetaminophen (TYLENOL) tablet 650 mg (has no administration in time range)    Or  acetaminophen (TYLENOL) suppository 650 mg (has no administration in time range)  ondansetron (ZOFRAN-ODT)  disintegrating tablet 4 mg (has no administration in time range)    Or  ondansetron (ZOFRAN) injection 4 mg (has no administration in time range)  simethicone (MYLICON) chewable tablet 40 mg (has no administration in time range)  sodium chloride 0.9 % bolus 1,000 mL (0 mLs Intravenous Stopped 10/12/18 1851)  cefTRIAXone (ROCEPHIN) 2 g in sodium chloride 0.9 % 100 mL IVPB (0 g Intravenous Stopped 10/12/18 1824)  metroNIDAZOLE (FLAGYL) IVPB 500 mg (0 mg Intravenous Stopped 10/12/18 1924)  lactated ringers bolus 1,000 mL (0 mLs Intravenous Stopped 10/12/18 1921)  potassium chloride SA (K-DUR,KLOR-CON) CR tablet 40 mEq (40 mEq Oral Given 10/12/18 1909)     Procedures Critical Care Critical Care Documentation Critical care time provided by me (excluding procedures): 36 minutes  Condition necessitating critical care: Concern for sepsis  Components of critical care management: reviewing of prior records, laboratory and imaging interpretation, frequent re-examination and reassessment of vital signs, administration of IV fluids, IV antibiotics.    ED Course and Medical Decision Making  I have reviewed the triage vital signs and the nursing notes.  Pertinent labs & imaging results that were available during my care of the patient were reviewed by me and considered in my medical decision making (see below for details).  Concern for postoperative infection, considering pneumonia, UTI, intra-abdominal source.  Patient is tachycardic to 130s, febrile earlier today, 99 by mouth here, code sepsis initiated.  Empiric ceftriaxone and Flagyl.  Patient also with focal neurological deficit for the past 2 days, will obtain CT head noncontrast.  Care assumed by Dr. Michaell Cowing, who will admit the patient.  According to chart review, CT imaging of the head and abdomen seems to have been discontinued without my knowledge.   Elmer Sow. Pilar Plate, MD [facility] Emergency [facility]  Health [email or web address]  Final Clinical Impressions(s) / ED Diagnoses     ICD-10-CM   1. Fever, unspecified R50.9     ED Discharge Orders    None         Sabas Sous, MD 10/12/18 2225

## 2018-10-12 NOTE — ED Notes (Signed)
ED TO INPATIENT HANDOFF REPORT  Name/Age/Gender [name] 53 y.o. female  Code Status    Code Status Orders  (From admission, onward)         Start     Ordered   10/12/18 1918  Full code  Continuous     10/12/18 1919        Code Status History    Date Active Date Inactive Code Status Order ID Comments User Context   10/04/2018 1418 10/05/2018 1456 Full Code [id number]  Karie Soda, MD Inpatient      Home/SNF/Other Home  Chief Complaint Post Op/Complications  Level of Care/Admitting Diagnosis ED Disposition    ED Disposition Condition Comment   Admit  Hospital Area: [facility] [[id number]]  Level of Care: Med-Surg [16]  Diagnosis: Symptomatic anemia [[id number]]  Admitting Physician: Karie Soda [[id number]]  Attending Physician: Karie Soda [[id number]]  Bed request comments: 5W preferred  PT Class (Do Not Modify): Observation [104]  PT Acc Code (Do Not Modify): Observation [10022]       Medical History Past Medical History:  Diagnosis Date  . Abnormal pap   . Cervicitis   . Endometrial polyp   . Hypertension   . Ovarian cyst, right     Allergies No Known Allergies  IV Location/Drains/Wounds Patient Lines/Drains/Airways Status   Active Line/Drains/Airways    Name:   Placement date:   Placement time:   Site:   Days:   Peripheral IV 10/12/18 Right Antecubital   10/12/18    1725    Antecubital   less than 1   Peripheral IV 10/12/18 Left Hand   10/12/18    1732    Hand   less than 1   Incision (Closed) 10/04/18 Abdomen Other (Comment)   10/04/18    1207     8   Incision - 3 Ports Abdomen Right Umbilicus Left   10/04/18    1015     8          Labs/Imaging Results for orders placed or performed during the hospital encounter of 10/12/18 (from the past 48 hour(s))  Lactic acid, plasma     Status: None   Collection Time: 10/12/18  5:04 PM  Result Value Ref Range   Lactic Acid, Venous 1.4 0.5 - 1.9 mmol/L    Comment: Performed at  [facility], [address], [place], [place] [address]  Comprehensive metabolic panel     Status: Abnormal   Collection Time: 10/12/18  5:04 PM  Result Value Ref Range   Sodium 133 (L) 135 - 145 mmol/L   Potassium 3.3 (L) 3.5 - 5.1 mmol/L   Chloride 95 (L) 98 - 111 mmol/L   CO2 26 22 - 32 mmol/L   Glucose, Bld 135 (H) 70 - 99 mg/dL   BUN 14 6 - 20 mg/dL   Creatinine, Ser 1.75 0.44 - 1.00 mg/dL   Calcium 8.7 (L) 8.9 - 10.3 mg/dL   Total Protein 8.0 6.5 - 8.1 g/dL   Albumin 4.0 3.5 - 5.0 g/dL   AST 26 15 - 41 U/L   ALT 27 0 - 44 U/L   Alkaline Phosphatase 88 38 - 126 U/L   Total Bilirubin 1.8 (H) 0.3 - 1.2 mg/dL   GFR calc non Af Amer >60 >60 mL/min   GFR calc Af Amer >60 >60 mL/min   Anion gap 12 5 - 15    Comment: Performed at [facility], [address] [place], [place] [address]  CBC WITH DIFFERENTIAL     Status: Abnormal   Collection Time: 10/12/18  5:04 PM  Result Value Ref Range   WBC 6.5 4.0 - 10.5 K/uL    Comment: REPEATED TO VERIFY WHITE COUNT CONFIRMED ON SMEAR    RBC 2.50 (L) 3.87 - 5.11 MIL/uL   Hemoglobin 6.6 (LL) 12.0 - 15.0 g/dL    Comment: REPEATED TO VERIFY THIS CRITICAL RESULT HAS VERIFIED AND BEEN CALLED TO SHEILA BIMGHAM,RN BY MARVIN SCOTTON ON 02 28 2020 AT 1822, AND HAS BEEN READ BACK.     HCT 22.2 (L) 36.0 - 46.0 %   MCV 88.8 80.0 - 100.0 fL   MCH 26.4 26.0 - 34.0 pg   MCHC 29.7 (L) 30.0 - 36.0 g/dL   RDW 46.914.2 62.911.5 - 52.815.5 %   Platelets 240 150 - 400 K/uL   nRBC 0.0 0.0 - 0.2 %   Neutrophils Relative % 84 %   Neutro Abs 5.5 1.7 - 7.7 K/uL   Lymphocytes Relative 6 %   Lymphs Abs 0.4 (L) 0.7 - 4.0 K/uL   Monocytes Relative 8 %   Monocytes Absolute 0.5 0.1 - 1.0 K/uL   Eosinophils Relative 1 %   Eosinophils Absolute 0.0 0.0 - 0.5 K/uL   Basophils Relative 0 %   Basophils Absolute 0.0 0.0 - 0.1 K/uL   WBC Morphology MORPHOLOGY UNREMARKABLE    Immature Granulocytes 1 %   Abs Immature Granulocytes 0.03 0.00  - 0.07 K/uL    Comment: Performed at [facility], [address] [place], [place] [address]  Lipase, blood     Status: None   Collection Time: 10/12/18  5:04 PM  Result Value Ref Range   Lipase 29 11 - 51 U/L    Comment: Performed at [facility], [address] [place], [place] [address]  Magnesium     Status: None   Collection Time: 10/12/18  5:04 PM  Result Value Ref Range   Magnesium 2.2 1.7 - 2.4 mg/dL    Comment: Performed at [facility], [address] [place], [place] [address]  Urinalysis, Routine w reflex microscopic     Status: Abnormal   Collection Time: 10/12/18  6:16 PM  Result Value Ref Range   Color, Urine YELLOW YELLOW   APPearance HAZY (A) CLEAR   Specific Gravity, Urine 1.011 1.005 - 1.030   pH 5.0 5.0 - 8.0   Glucose, UA NEGATIVE NEGATIVE mg/dL   Hgb urine dipstick LARGE (A) NEGATIVE   Bilirubin Urine NEGATIVE NEGATIVE   Ketones, ur NEGATIVE NEGATIVE mg/dL   Protein, ur 30 (A) NEGATIVE mg/dL   Nitrite NEGATIVE NEGATIVE   Leukocytes,Ua TRACE (A) NEGATIVE   RBC / HPF 0-5 0 - 5 RBC/hpf   WBC, UA 6-10 0 - 5 WBC/hpf   Bacteria, UA RARE (A) NONE SEEN   Squamous Epithelial / LPF 0-5 0 - 5   Mucus PRESENT     Comment: Performed at [facility], [address] [place], [place] [address]   Dg Chest 2 View  Result Date: 10/12/2018 CLINICAL DATA:  53 y/o F; fever, evaluate for pneumonia. 09/16/2018 hernia repair. EXAM: CHEST - 2 VIEW COMPARISON:  05/09/2017 chest radiograph FINDINGS: Stable heart size and mediastinal contours are within normal limits. Both lungs are clear. The visualized skeletal structures are unremarkable. IMPRESSION: No acute pulmonary process identified. Electronically Signed   By: Buzzy HanLance  Furusawa-Stratton M.D.  On: 10/12/2018 18:59    Pending Labs Unresulted Labs (From admission, onward)    Start     Ordered   10/13/18 0500  CBC  Tomorrow morning,   R    Question:   Specimen collection method  Answer:  IV Team   10/12/18 1915   10/13/18 0500  Magnesium  Tomorrow morning,   R    Question:  Specimen collection method  Answer:  IV Team   10/12/18 1915   10/13/18 0500  Potassium  Tomorrow morning,   R    Comments:  If K < 3.5, give KCl in runs per protocol.  Pharmacy may adjust dosing strength, schedule, rate of infusion, etc as needed to optimize therapy   Question:  Specimen collection method  Answer:  IV Team   10/12/18 1915   10/13/18 0500  HIV antibody (Routine Testing)  Tomorrow morning,   R     10/12/18 1919   10/12/18 1912  Prepare RBC  (Adult Blood Administration - Red Blood Cells)  Once,   R    Question Answer Comment  # of Units 1 unit   Transfusion Indications Symptomatic Anemia   If emergent release call blood bank Not emergent release   Instructions: Transfuse      10/12/18 1912   10/12/18 1830  Type and screen [facility]  ONCE - STAT,   R    Comments:  [facility]   Question:  Special Requirements  Answer:  for Red Blood Exchange   10/12/18 1829   10/12/18 1704  Lactic acid, plasma  Now then every 2 hours,   STAT     10/12/18 1707   10/12/18 1704  Blood Culture (routine x 2)  BLOOD CULTURE X 2,   STAT     10/12/18 1707          Vitals/Pain Today's Vitals   10/12/18 1913 10/12/18 1915 10/12/18 1916 10/12/18 1919  BP:  91/78 91/78 115/70  Pulse:   (!) 110 (!) 119  Resp: 17 (!) 23 (!) 21 (!) 21  Temp:      TempSrc:      SpO2:    94%  Weight:      Height:      PainSc:        Isolation Precautions No active isolations  Medications Medications  0.9 %  sodium chloride infusion (Manually program via Guardrails IV Fluids) (has no administration in time range)  methocarbamol (ROBAXIN) 1,000 mg in dextrose 5 % 50 mL IVPB (has no administration in time range)  methocarbamol (ROBAXIN) tablet 1,000 mg (has no administration in time range)  naproxen (NAPROSYN) tablet 500 mg  (has no administration in time range)  acetaminophen (TYLENOL) tablet 1,000 mg (has no administration in time range)  gabapentin (NEURONTIN) capsule 300 mg (has no administration in time range)  traMADol (ULTRAM) tablet 50-100 mg (has no administration in time range)  ondansetron (ZOFRAN) 8 mg in sodium chloride 0.9 % 50 mL IVPB (has no administration in time range)  prochlorperazine (COMPAZINE) injection 5-10 mg (has no administration in time range)  lip balm (CARMEX) ointment 1 application (has no administration in time range)  magic mouthwash (has no administration in time range)  psyllium (HYDROCIL/METAMUCIL) packet 1 packet (has no administration in time range)  polyethylene glycol (MIRALAX / GLYCOLAX) packet 17 g (has no administration in time range)  bisacodyl (DULCOLAX) suppository 10 mg (has no administration in time range)  guaiFENesin-dextromethorphan (ROBITUSSIN  DM) 100-10 MG/5ML syrup 10 mL (has no administration in time range)  hydrocortisone (ANUSOL-HC) 2.5 % rectal cream 1 application (has no administration in time range)  alum & mag hydroxide-simeth (MAALOX/MYLANTA) 200-200-20 MG/5ML suspension 30 mL (has no administration in time range)  hydrocortisone cream 1 % 1 application (has no administration in time range)  menthol-cetylpyridinium (CEPACOL) lozenge 3 mg (has no administration in time range)  phenol (CHLORASEPTIC) mouth spray 1-2 spray (has no administration in time range)  diphenhydrAMINE (BENADRYL) injection 12.5-25 mg (has no administration in time range)  vitamin C (ASCORBIC ACID) tablet 500 mg (has no administration in time range)  ferrous sulfate tablet 325 mg (has no administration in time range)  lactated ringers bolus 1,000 mL (has no administration in time range)  lactated ringers infusion (has no administration in time range)  acetaminophen (TYLENOL) tablet 650 mg (has no administration in time range)    Or  acetaminophen (TYLENOL) suppository 650 mg (has no  administration in time range)  ondansetron (ZOFRAN-ODT) disintegrating tablet 4 mg (has no administration in time range)    Or  ondansetron (ZOFRAN) injection 4 mg (has no administration in time range)  simethicone (MYLICON) chewable tablet 40 mg (has no administration in time range)  sodium chloride 0.9 % bolus 1,000 mL (0 mLs Intravenous Stopped 10/12/18 1851)  cefTRIAXone (ROCEPHIN) 2 g in sodium chloride 0.9 % 100 mL IVPB (0 g Intravenous Stopped 10/12/18 1824)  metroNIDAZOLE (FLAGYL) IVPB 500 mg (500 mg Intravenous New Bag/Given 10/12/18 1824)  lactated ringers bolus 1,000 mL (0 mLs Intravenous Stopped 10/12/18 1921)  potassium chloride SA (K-DUR,KLOR-CON) CR tablet 40 mEq (40 mEq Oral Given 10/12/18 1909)    Mobility walks with person assist

## 2018-10-12 NOTE — ED Notes (Signed)
Patient aware we need urine sample. Patient will call out when ready to void.  

## 2018-10-12 NOTE — ED Notes (Signed)
Both sets of cultures taken before antibiotics started. RAC and LH

## 2018-10-12 NOTE — ED Notes (Signed)
Patient has a rash on left AC that she noticed yesterday. Patient denies pain or itching. Pilar Plate, MD made aware. Waiting for orders. Rash noticed by this RN before starting IVs.

## 2018-10-13 ENCOUNTER — Other Ambulatory Visit: Payer: Self-pay

## 2018-10-13 DIAGNOSIS — D649 Anemia, unspecified: Secondary | ICD-10-CM | POA: Diagnosis not present

## 2018-10-13 DIAGNOSIS — Z9889 Other specified postprocedural states: Secondary | ICD-10-CM | POA: Diagnosis not present

## 2018-10-13 DIAGNOSIS — R000 Tachycardia, unspecified: Secondary | ICD-10-CM | POA: Diagnosis not present

## 2018-10-13 DIAGNOSIS — I10 Essential (primary) hypertension: Secondary | ICD-10-CM | POA: Diagnosis not present

## 2018-10-13 DIAGNOSIS — R509 Fever, unspecified: Secondary | ICD-10-CM | POA: Diagnosis not present

## 2018-10-13 LAB — CBC
HCT: 23.3 % — ABNORMAL LOW (ref 36.0–46.0)
Hemoglobin: 7.3 g/dL — ABNORMAL LOW (ref 12.0–15.0)
MCH: 28.1 pg (ref 26.0–34.0)
MCHC: 31.3 g/dL (ref 30.0–36.0)
MCV: 89.6 fL (ref 80.0–100.0)
Platelets: 204 10*3/uL (ref 150–400)
RBC: 2.6 MIL/uL — ABNORMAL LOW (ref 3.87–5.11)
RDW: 14 % (ref 11.5–15.5)
WBC: 6.3 10*3/uL (ref 4.0–10.5)
nRBC: 0 % (ref 0.0–0.2)

## 2018-10-13 LAB — POTASSIUM: Potassium: 3.5 mmol/L (ref 3.5–5.1)

## 2018-10-13 LAB — MAGNESIUM: Magnesium: 2.2 mg/dL (ref 1.7–2.4)

## 2018-10-13 LAB — PREPARE RBC (CROSSMATCH)

## 2018-10-13 MED ORDER — SODIUM CHLORIDE 0.9% IV SOLUTION
Freq: Once | INTRAVENOUS | Status: AC
  Administered 2018-10-13: 11:00:00 via INTRAVENOUS

## 2018-10-13 MED ORDER — LORAZEPAM 2 MG/ML IJ SOLN: 0.5000 mg | Freq: Three times a day (TID) | INTRAMUSCULAR | Status: DC | PRN

## 2018-10-13 MED ORDER — SODIUM CHLORIDE 0.9 % IV SOLN
25.0000 mg | Freq: Once | INTRAVENOUS | Status: AC
  Administered 2018-10-13: 25 mg via INTRAVENOUS
  Filled 2018-10-13: qty 0.5

## 2018-10-13 MED ORDER — SODIUM CHLORIDE 0.9 % IV SOLN
500.0000 mg | Freq: Once | INTRAVENOUS | Status: AC
  Administered 2018-10-13: 500 mg via INTRAVENOUS
  Filled 2018-10-13: qty 10

## 2018-10-13 NOTE — Evaluation (Signed)
Physical Therapy Evaluation Patient Details Name: [name] MRN: [id number] DOB: Jun 03, 1966 Today's Date: 10/13/2018   History of Present Illness  53 year old female with chronic right groin pain.  Suspicious to have some inguinal canal mass versus hernia.  Underwent laparoscopic repair of bilateral inguinal hernias.  Resection of round ligament cystic mass.  Pathology benign.  Patient did have venous bleeder from obturator vein intraoperatively that was controlled with suture ligation.   Clinical Impression  Pt presents with R hip weakness. Pt with a recent hernia repair. 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 Pt was currently receiving blood products therefore mobility was not tested at this present time. Pt reported she has been up ambulating to the BR without difficulty. Pt does present with hip flexor and Adductor weakness 2+/5. Started pt on a HEP to focus on these areas. Pt and spouse demonstrated clear understanding. Feel pt will benefit from another session of PT prior to D/C home to observe gait and reinforce HEP.     Follow Up Recommendations No PT follow up    Equipment Recommendations  None recommended by PT    Recommendations for Other Services       Precautions / Restrictions Precautions Precautions: Other (comment)(orthostatics) Restrictions Weight Bearing Restrictions: No      Mobility  Bed Mobility               General bed mobility comments: NT due to receiving blood products  Transfers                 General transfer comment: NT due to receiving blood products  Ambulation/Gait             General Gait Details: NT due to receiving blood products  Stairs            Wheelchair Mobility    Modified Rankin (Stroke Patients Only)       Balance                                             Pertinent Vitals/Pain Pain Assessment: Faces Faces Pain Scale: Hurts a little bit Pain Location: R  groin Pain Descriptors / Indicators: Discomfort Pain Intervention(s): Limited activity within patient's tolerance;Monitored during session;Repositioned    Home Living Family/patient expects to be discharged to:: Private residence Living Arrangements: Spouse/significant other Available Help at Discharge: Family             Additional Comments: Pt is an Charity fundraiser at [employer] [facility]    Prior Function Level of Independence: Independent               Hand Dominance        Extremity/Trunk Assessment        Lower Extremity Assessment Lower Extremity Assessment: RLE deficits/detail RLE Deficits / Details: Hip flexion 2+/5, Hip Add 2+/5, Abd 4/5,  RLE Sensation: WNL RLE Coordination: decreased gross motor(R LE)    Cervical / Trunk Assessment Cervical / Trunk Assessment: Normal  Communication   Communication: No difficulties  Cognition Arousal/Alertness: Awake/alert Behavior During Therapy: WFL for tasks assessed/performed Overall Cognitive Status: Within Functional Limits for tasks assessed                                 General Comments: currently receiving blood products      General  Comments General comments (skin integrity, edema, etc.): Pt hbg was 6.6 yesterday. This am 7.2 and was currently receiving another unit of blood. Evaluated LE strength and started a LE HEP. Pt reports she has been up waking to the BR without difficulty.    Exercises General Exercises - Lower Extremity Heel Slides: AAROM;Strengthening;Right;20 reps;Supine Hip ABduction/ADduction: AROM;AAROM;Strengthening;Right;20 reps;Supine Straight Leg Raises: AAROM;Strengthening;Right;Supine;20 reps Hip Flexion/Marching: (discussed for HEP) Other Exercises Other Exercises: Adducter pillow squeeze x20 supine AROM   Assessment/Plan    PT Assessment Patient needs continued PT services  PT Problem List Decreased strength;Decreased coordination;Decreased mobility       PT Treatment  Interventions Patient/family education;Gait training;Therapeutic exercise;Functional mobility training;Balance training;Neuromuscular re-education    PT Goals (Current goals can be found in the Care Plan section)  Acute Rehab PT Goals Patient Stated Goal: To get leg stronger PT Goal Formulation: With patient Time For Goal Achievement: 10/20/18 Potential to Achieve Goals: Good    Frequency Min 3X/week   Barriers to discharge        Co-evaluation               AM-PAC PT "6 Clicks" Mobility  Outcome Measure Help needed turning from your back to your side while in a flat bed without using bedrails?: None Help needed moving from lying on your back to sitting on the side of a flat bed without using bedrails?: None Help needed moving to and from a bed to a chair (including a wheelchair)?: None Help needed standing up from a chair using your arms (e.g., wheelchair or bedside chair)?: None Help needed to walk in hospital room?: None Help needed climbing 3-5 steps with a railing? : None 6 Click Score: 24    End of Session   Activity Tolerance: Patient tolerated treatment well Patient left: in bed;with call bell/phone within reach;with family/visitor present Nurse Communication: Mobility status PT Visit Diagnosis: Muscle weakness (generalized) (M62.81)    Time: 1219-7588 PT Time Calculation (min) (ACUTE ONLY): 27 min   Charges:   PT Evaluation $PT Eval Low Complexity: 1 Low PT Treatments $Therapeutic Exercise: 8-22 mins        Lilyan Punt, PT  Greggory Stallion 10/13/2018, 1:44 PM

## 2018-10-13 NOTE — Progress Notes (Signed)
[name] [id number] Feb 25, 1966  CARE TEAM:  PCP: Mirna Mires, MD  Outpatient Care Team: Patient Care Team: Mirna Mires, MD as PCP - General (Family Medicine) Karie Soda, MD as Consulting Physician (General Surgery)  Inpatient Treatment Team: Treatment Team: Attending Provider: Karie Soda, MD; Consulting Physician: Karie Soda, MD; Technician: Altamese Ratamosa, [place]; Physical Therapist: Casimiro Needle, PT; Registered Nurse: Quentin Cornwall, RN   Problem List:   Active Problems:   Acquired cyst of canal of [place] s/p right round ligament excision 10/04/2018   Bilateral inguinal herniae s/p lap repair w mesh 10/04/2018   Tachycardia   Symptomatic anemia      10/04/2018  Procedure(s): LAPAROSCOPIC BILATERAL INGUINAL HERNIA REPAIR WITH MESH INCISON OF CYST MASS RIGHT ROUND LIGAMENT WITH TAP BLOCK    Assessment  Improving  [facility] Stay = 0 days)  Plan:  Another pRBCs since orthostatic  IV iron x 1   Pain control.  I tried to frame expectation that is typical to have some soreness less than 2 weeks status post bilateral inguinal hernia surgery.  Seems to be controlled with just naproxen or tramadol.  Good pain control fast recovery.  Avoiding pain pills until in horrible pain, more difficult recovery  Stop IVF  Check orthostatics  Regular diet.  PT eval.  Question of mild hip flexion weakness on the right side.  Seems close to normal to me and was walking well in the hallways on floor.  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  Should improve with time.  Make sure she can ambulate well.  Mobilize as tolerated to help recovery   VTE prophylaxis- SCDs refused.  Hold anticoag   20 minutes spent in review, evaluation, examination, counseling, and coordination of care.  More than 50% of that time was spent in counseling.  10/13/2018    Subjective: (Chief complaint)  Anxious but better  Dissappointed that she is not totally pain-free  yet.  Walking well is in hallways.  Husband in room.  No nausea or vomiting.    Objective:  Vital signs:  Vitals:   10/12/18 2337 10/13/18 0100 10/13/18 0233 10/13/18 0518  BP: 128/79  117/74 129/81  Pulse: (!) 112 100 93 (!) 102  Resp: Temp: 99.4 F (37.4 C)  99.2 F (37.3 C) 98.8 F (37.1 C)  TempSrc: Oral  Oral Oral  SpO2: 100%  100% 100%  Weight:      Height:           Intake/Output   Yesterday:  02/28 0701 - 02/29 0700 In: 2550 [Blood:350; IV Piggyback:2200] Out: -  This shift:  No intake/output data recorded.  Bowel function:  Flatus: YES  BM:  No  Drain: (No drain)   Physical Exam:  General: Pt awake/alert/oriented x4 in no acute distress Eyes: PERRL, normal EOM.  Sclera clear.  No icterus Neuro: CN II-XII intact w/o focal sensory/motor deficits. Lymph: No head/neck/groin lymphadenopathy Psych:  No delerium/psychosis/paranoia.  Anxious but consolable. HENT: Normocephalic, Mucus membranes moist.  No thrush Neck: Supple, No tracheal deviation Chest: No chest wall pain w good excursion CV:  Pulses intact.  Regular rhythm MS: Normal AROM mjr joints.  No obvious deformity.  Lifts right thigh fine.  Perhaps slightly less aggressively than on left side.  No buckling nor weakness.   Standing and walking fine.  No obvious major sensory deficits. Abdomen: Soft.  Nondistended.  Nontender.  No evidence of peritonitis.  No incarcerated hernias.  Mild right groin  discomfort with no ecchymosis or hematoma.  Ext:  No deformity.  No mjr edema.  No cyanosis Skin: No petechiae / purpura  Results:   Labs: Results for orders placed or performed during the hospital encounter of 10/12/18 (from the past 48 hour(s))  Lactic acid, plasma     Status: None   Collection Time: 10/12/18  5:04 PM  Result Value Ref Range   Lactic Acid, Venous 1.4 0.5 - 1.9 mmol/L    Comment: Performed at [facility], [address], [place], [place]  [address]  Comprehensive metabolic panel     Status: Abnormal   Collection Time: 10/12/18  5:04 PM  Result Value Ref Range   Sodium 133 (L) 135 - 145 mmol/L   Potassium 3.3 (L) 3.5 - 5.1 mmol/L   Chloride 95 (L) 98 - 111 mmol/L   CO2 26 22 - 32 mmol/L   Glucose, Bld 135 (H) 70 - 99 mg/dL   BUN 14 6 - 20 mg/dL   Creatinine, Ser 6.22 0.44 - 1.00 mg/dL   Calcium 8.7 (L) 8.9 - 10.3 mg/dL   Total Protein 8.0 6.5 - 8.1 g/dL   Albumin 4.0 3.5 - 5.0 g/dL   AST 26 15 - 41 U/L   ALT 27 0 - 44 U/L   Alkaline Phosphatase 88 38 - 126 U/L   Total Bilirubin 1.8 (H) 0.3 - 1.2 mg/dL   GFR calc non Af Amer >60 >60 mL/min   GFR calc Af Amer >60 >60 mL/min   Anion gap 12 5 - 15    Comment: Performed at [facility], [address], [place], [place] [address]  CBC WITH DIFFERENTIAL     Status: Abnormal   Collection Time: 10/12/18  5:04 PM  Result Value Ref Range   WBC 6.5 4.0 - 10.5 K/uL    Comment: REPEATED TO VERIFY WHITE COUNT CONFIRMED ON SMEAR    RBC 2.50 (L) 3.87 - 5.11 MIL/uL   Hemoglobin 6.6 (LL) 12.0 - 15.0 g/dL    Comment: REPEATED TO VERIFY THIS CRITICAL RESULT HAS VERIFIED AND BEEN CALLED TO SHEILA BIMGHAM,RN BY MARVIN SCOTTON ON 02 28 2020 AT 1822, AND HAS BEEN READ BACK.     HCT 22.2 (L) 36.0 - 46.0 %   MCV 88.8 80.0 - 100.0 fL   MCH 26.4 26.0 - 34.0 pg   MCHC 29.7 (L) 30.0 - 36.0 g/dL   RDW 45.6 25.6 - 38.9 %   Platelets 240 150 - 400 K/uL   nRBC 0.0 0.0 - 0.2 %   Neutrophils Relative % 84 %   Neutro Abs 5.5 1.7 - 7.7 K/uL   Lymphocytes Relative 6 %   Lymphs Abs 0.4 (L) 0.7 - 4.0 K/uL   Monocytes Relative 8 %   Monocytes Absolute 0.5 0.1 - 1.0 K/uL   Eosinophils Relative 1 %   Eosinophils Absolute 0.0 0.0 - 0.5 K/uL   Basophils Relative 0 %   Basophils Absolute 0.0 0.0 - 0.1 K/uL   WBC Morphology MORPHOLOGY UNREMARKABLE    Immature Granulocytes 1 %   Abs Immature Granulocytes 0.03 0.00 - 0.07 K/uL    Comment: Performed at [facility],  [address], [place], [place] [address]  Lipase, blood     Status: None   Collection Time: 10/12/18  5:04 PM  Result Value Ref Range   Lipase 29 11 - 51 U/L    Comment: Performed at [facility], [address] [place], [place] [address]  Magnesium     Status: None   Collection Time: 10/12/18  5:04 PM  Result Value Ref Range   Magnesium 2.2 1.7 - 2.4 mg/dL    Comment: Performed at [facility], [address] [place], [place] [address]  Urinalysis, Routine w reflex microscopic     Status: Abnormal   Collection Time: 10/12/18  6:16 PM  Result Value Ref Range   Color, Urine YELLOW YELLOW   APPearance HAZY (A) CLEAR   Specific Gravity, Urine 1.011 1.005 - 1.030   pH 5.0 5.0 - 8.0   Glucose, UA NEGATIVE NEGATIVE mg/dL   Hgb urine dipstick LARGE (A) NEGATIVE   Bilirubin Urine NEGATIVE NEGATIVE   Ketones, ur NEGATIVE NEGATIVE mg/dL   Protein, ur 30 (A) NEGATIVE mg/dL   Nitrite NEGATIVE NEGATIVE   Leukocytes,Ua TRACE (A) NEGATIVE   RBC / HPF 0-5 0 - 5 RBC/hpf   WBC, UA 6-10 0 - 5 WBC/hpf   Bacteria, UA RARE (A) NONE SEEN   Squamous Epithelial / LPF 0-5 0 - 5   Mucus PRESENT     Comment: Performed at [facility], [address] [place], [place] [address]  Type and screen [facility]     Status: None (Preliminary result)   Collection Time: 10/12/18  6:33 PM  Result Value Ref Range   ABO/RH(D) O NEG    Antibody Screen NEG    Sample Expiration 10/15/2018    Unit Number [id number]    Blood Component Type RED CELLS,LR    Unit division 00    Status of Unit ISSUED    Transfusion Status OK TO TRANSFUSE    Crossmatch Result      Compatible Performed at [facility], [address] [place], [place] [address]   Prepare RBC     Status: None   Collection Time: 10/12/18  6:33 PM  Result Value Ref Range   Order Confirmation      ORDER PROCESSED BY BLOOD BANK Performed at [facility], [address] [place], [place] [address]   Lactic acid, plasma     Status: None   Collection Time: 10/12/18  9:04 PM  Result Value Ref Range   Lactic Acid, Venous 1.1 0.5 - 1.9 mmol/L    Comment: Performed at [facility], [address] [place], [place] [address]  CBC     Status: Abnormal   Collection Time: 10/13/18  6:08 AM  Result Value Ref Range   WBC 6.3 4.0 - 10.5 K/uL   RBC 2.60 (L) 3.87 - 5.11 MIL/uL   Hemoglobin 7.3 (L) 12.0 - 15.0 g/dL   HCT 16.023.3 (L) 10.936.0 - 32.346.0 %   MCV 89.6 80.0 - 100.0 fL   MCH 28.1 26.0 - 34.0 pg   MCHC 31.3 30.0 - 36.0 g/dL   RDW 55.714.0 32.211.5 - 02.515.5 %   Platelets 204 150 - 400 K/uL   nRBC 0.0 0.0 - 0.2 %    Comment: Performed at [facility], [address] [place], [place] [address]  Magnesium     Status: None   Collection Time: 10/13/18  6:08 AM  Result Value Ref Range   Magnesium 2.2 1.7 - 2.4 mg/dL    Comment: Performed at [facility], [address] [place], [place] [address]  Potassium     Status: None   Collection Time: 10/13/18  6:08 AM  Result Value Ref Range   Potassium 3.5 3.5 - 5.1 mmol/L    Comment: Performed at [facility], [address], [place], [place] [address]    Imaging / Studies: Dg Chest 2 View  Result Date: 10/12/2018 CLINICAL DATA:  53 y/o F; fever, evaluate for pneumonia. 09/16/2018 hernia repair. EXAM: CHEST - 2 VIEW COMPARISON:  05/09/2017 chest radiograph FINDINGS: Stable heart size and mediastinal contours are within normal limits. Both lungs are clear. The visualized skeletal structures are unremarkable. IMPRESSION: No acute pulmonary process identified. Electronically Signed   By: Mitzi Hansen M.D.   On: 10/12/2018 18:59    Medications / Allergies: per chart  Antibiotics: Anti-infectives (From admission, onward)   Start     Dose/Rate Route Frequency Ordered Stop   10/12/18 1715  cefTRIAXone  (ROCEPHIN) 2 g in sodium chloride 0.9 % 100 mL IVPB     2 g 200 mL/hr over 30 Minutes Intravenous  Once 10/12/18 1707 10/12/18 1824   10/12/18 1715  metroNIDAZOLE (FLAGYL) IVPB 500 mg     500 mg 100 mL/hr over 60 Minutes Intravenous  Once 10/12/18 1707 10/12/18 1924        Note: Portions of this report may have been transcribed using voice recognition software. Every effort was made to ensure accuracy; however, inadvertent computerized transcription errors may be present.   Any transcriptional errors that result from this process are unintentional.     [name], MD, FACS, MASCRS Gastrointestinal and Minimally Invasive Surgery    [address], [address] [place], [place] [address] [phone] Main / Paging [phone] Fax

## 2018-10-13 NOTE — Plan of Care (Signed)
Patient lying in bed this morning. Complains of pain in lower abdomen, right groin.

## 2018-10-13 NOTE — Progress Notes (Signed)
Patient does not want to do orthostatic vital signs; states she understands what they are for and why they are done, but she does not feel she needs them. Would like for order to be discontinued.

## 2018-10-14 DIAGNOSIS — D649 Anemia, unspecified: Secondary | ICD-10-CM | POA: Diagnosis not present

## 2018-10-14 DIAGNOSIS — Z9889 Other specified postprocedural states: Secondary | ICD-10-CM | POA: Diagnosis not present

## 2018-10-14 DIAGNOSIS — R509 Fever, unspecified: Secondary | ICD-10-CM | POA: Diagnosis not present

## 2018-10-14 DIAGNOSIS — I10 Essential (primary) hypertension: Secondary | ICD-10-CM | POA: Diagnosis not present

## 2018-10-14 DIAGNOSIS — G5781 Other specified mononeuropathies of right lower limb: Secondary | ICD-10-CM

## 2018-10-14 DIAGNOSIS — R000 Tachycardia, unspecified: Secondary | ICD-10-CM | POA: Diagnosis not present

## 2018-10-14 LAB — BLOOD CULTURE ID PANEL (REFLEXED)

## 2018-10-14 LAB — HIV ANTIBODY (ROUTINE TESTING W REFLEX): HIV Screen 4th Generation wRfx: NONREACTIVE

## 2018-10-14 LAB — CBC
HCT: 28.3 % — ABNORMAL LOW (ref 36.0–46.0)
Hemoglobin: 9 g/dL — ABNORMAL LOW (ref 12.0–15.0)
MCH: 28 pg (ref 26.0–34.0)
MCHC: 31.8 g/dL (ref 30.0–36.0)
MCV: 87.9 fL (ref 80.0–100.0)
NRBC: 0 % (ref 0.0–0.2)
Platelets: 256 10*3/uL (ref 150–400)
RBC: 3.22 MIL/uL — ABNORMAL LOW (ref 3.87–5.11)
RDW: 14.3 % (ref 11.5–15.5)
WBC: 8.8 10*3/uL (ref 4.0–10.5)

## 2018-10-14 NOTE — Progress Notes (Signed)
PHARMACY - PHYSICIAN COMMUNICATION CRITICAL VALUE ALERT - BLOOD CULTURE IDENTIFICATION (BCID)  [name] is an 53 y.o. female who presented to [facility] on 10/12/2018 with a chief complaint of LAPAROSCOPIC BILATERAL INGUINAL HERNIA REPAIR WITH MESH INCISON OF CYST MASS RIGHT ROUND LIGAMENT WITH TAP BLOCK  Assessment:  One out of two bottles gram statin GNR, but nothing resulted on BCID.  (include suspected source if known)  Name of physician (or Provider) Contacted: Dr. Maisie Fus  Current antibiotics: none  Changes to prescribed antibiotics recommended:  MD will have AM team address, no antibiotics at this time, will allow for final culture to result.  Results for orders placed or performed during the hospital encounter of 10/12/18  Blood Culture ID Panel (Reflexed) (Collected: 10/12/2018  5:28 PM)  Result Value Ref Range   Enterococcus species NOT DETECTED NOT DETECTED   Listeria monocytogenes NOT DETECTED NOT DETECTED   Staphylococcus species NOT DETECTED NOT DETECTED   Staphylococcus aureus (BCID) NOT DETECTED NOT DETECTED   Streptococcus species NOT DETECTED NOT DETECTED   Streptococcus agalactiae NOT DETECTED NOT DETECTED   Streptococcus pneumoniae NOT DETECTED NOT DETECTED   Streptococcus pyogenes NOT DETECTED NOT DETECTED   Acinetobacter baumannii NOT DETECTED NOT DETECTED   Enterobacteriaceae species NOT DETECTED NOT DETECTED   Enterobacter cloacae complex NOT DETECTED NOT DETECTED   Escherichia coli NOT DETECTED NOT DETECTED   Klebsiella oxytoca NOT DETECTED NOT DETECTED   Klebsiella pneumoniae NOT DETECTED NOT DETECTED   Proteus species NOT DETECTED NOT DETECTED   Serratia marcescens NOT DETECTED NOT DETECTED   Haemophilus influenzae NOT DETECTED NOT DETECTED   Neisseria meningitidis NOT DETECTED NOT DETECTED   Pseudomonas aeruginosa NOT DETECTED NOT DETECTED   Candida albicans NOT DETECTED NOT DETECTED   Candida glabrata NOT DETECTED NOT DETECTED   Candida  krusei NOT DETECTED NOT DETECTED   Candida parapsilosis NOT DETECTED NOT DETECTED   Candida tropicalis NOT DETECTED NOT DETECTED    Aleene Davidson Crowford 10/14/2018  12:46 AM

## 2018-10-14 NOTE — Discharge Summary (Signed)
Physician Discharge Summary    Patient ID: [name] MRN: [id number] DOB/AGE: [birth year]  53 y.o.  Patient Care Team: Mirna Mires, MD as PCP - General (Family Medicine) Karie Soda, MD as Consulting Physician (General Surgery)  Admit date: 10/12/2018  Discharge date: 10/14/2018  Hospital Stay = 0 days    Discharge Diagnoses:  Active Problems:   Acquired cyst of canal of [place]uck s/p right round ligament excision 10/04/2018   Bilateral inguinal herniae s/p lap repair w mesh 10/04/2018   Tachycardia   Symptomatic anemia   Neuropathy, obturator nerve, right      * No surgery found *  POST-OPERATIVE DIAGNOSIS:   * No surgery found *  SURGERY:  * No surgery found *    SURGEON:    * Surgery not found *  Consults: physical therapy  Hospital Course:   Patient with tachycardia and fever with persistent right leg weakness.  Concern.  Came emergency room.  Hemoglobin less than 7.  Transfused.  Evidence of weakness along right obturator nerve with hip flexion and adduction.  Physical therapy evaluation done.  Some weakness confirmed the patient able to ambulate as well as transfer to bed.  Pain controlled with Tylenol and tramadol.  Packed.  Tolerating regular diet.  Hemoglobin above 7 but with some questionable orthostasis.  Transfused another unit of blood.  IV iron given.  By the time of discharge, the patient was walking well the hallways, eating food, having flatus.  Pain was well-controlled on an oral medications.  Hemoglobin improved to over 9.  Patient initially declined having orthostatics checked but eventually relented.  No orthostasis.  I had many discussions with the patient as well as her husband.  I tried to reassure her that obturator varicosities resolve over time with the help of physical therapy.  No strong evidence of permanent nerve injury or tendon injury.  I cautioned the patient that it can take up to 6 months for an obturator nerve palsy to fully  resolve.  Most the time things are markedly improved by 3 months.  She was disappointed she was not asymptomatic yet, POD#9.  Based on meeting discharge criteria and continuing to recover, I felt it was safe for the patient to be discharged from the hospital to further recover with close followup at our office.  Physical therapy did not feel that she needed home health physical therapy.  Hip flexion and adduction exercises discussed and instructions written.  Postoperative recommendations were discussed in detail.  Plan discussed with her nurse as well.  They are written as well.  Discharged Condition: Anxious but improved  Discharge Exam: Blood pressure 123/79, pulse (!) 101, temperature 98.9 F (37.2 C), temperature source Oral, resp. rate 18, height 5\' 6"  (1.676 m), weight 75.2 kg, last menstrual period 08/16/2015, SpO2 100 %.  General: Pt awake/alert/oriented x4 in No acute distress Eyes: PERRL, normal EOM.  Sclera clear.  No icterus Neuro: CN II-XII intact w/o focal sensory/motor deficits. Lymph: No head/neck/groin lymphadenopathy Psych:  No delerium/psychosis/paranoia.  Worried and anxious.  Mostly consolable. HENT: Normocephalic, Mucus membranes moist.  No thrush Neck: Supple, No tracheal deviation Chest: No chest wall pain w good excursion CV:  Pulses intact.  Regular rhythm MS: Normal AROM mjr joints.  Slower hip flexion and adduction on the right side but able to function.  No obvious deformity Abdomen: Soft.  Nondistended.  Nontender.  No evidence of peritonitis.  No incarcerated hernias. GU: Mild right groin discomfort but no hematoma or  seroma. Ext:  SCDs BLE.  No mjr edema.  No cyanosis Skin: No petechiae / purpura   Disposition:   Follow-up Information    Karie Soda, MD. Schedule an appointment as soon as possible for a visit in 3 week(s).   Specialty:  General Surgery Contact information: [address] [place] [address] [phone]            Discharge disposition: 01-Home or Self Care       Discharge Instructions    Call MD for:   Complete by:  As directed    FEVER > 101.5 F  (temperatures < 101.5 F are not significant)   Call MD for:  extreme fatigue   Complete by:  As directed    Call MD for:  persistant dizziness or light-headedness   Complete by:  As directed    Call MD for:  persistant nausea and vomiting   Complete by:  As directed    Call MD for:  redness, tenderness, or signs of infection (pain, swelling, redness, odor or green/yellow discharge around incision site)   Complete by:  As directed    Call MD for:  severe uncontrolled pain   Complete by:  As directed    Diet - low sodium heart healthy   Complete by:  As directed    Start with a bland diet such as soups, liquids, starchy foods, low fat foods, etc. the first few days at home. Gradually advance to a solid, low-fat, high fiber diet by the end of the first week at home.   Add a fiber supplement to your diet (Metamucil, etc) If you feel full, bloated, or constipated, stay on a full liquid or pureed/blenderized diet for a few days until you feel better and are no longer constipated.   Discharge instructions   Complete by:  As directed    See Discharge Instructions If you are not getting better after two weeks or are noticing you are getting worse, contact our office [phone] for further advice.  We may need to adjust your medications, re-evaluate you in the office, send you to the emergency room, or see what other things we can do to help. The clinic staff is available to answer your questions during regular business hours (8:30am-5pm).  Please don't hesitate to call and ask to speak to one of our nurses for clinical concerns.    A surgeon from [facility] is always on call at the hospitals 24 hours/day If you have a medical emergency, go to the nearest emergency room or call 911.   Discharge wound care:   Complete by:  As  directed    It is good for closed incisions to be washed every day.  Shower every day.  Short baths are fine.  Wash the incisions and wounds clean with soap & water.     You may leave closed incisions open to air if it is dry.   You may cover the incision with clean gauze & replace it after your daily shower for comfort.   Driving Restrictions   Complete by:  As directed    You may drive no sooner than 16 March AND ONLY WHEN: - you are no longer taking narcotic prescription pain medication - you can comfortably wear a seatbelt - you can move your legs to work the pedals - you can safely make sudden turns/stops without pain.   Increase activity slowly   Complete by:  As directed  Start light daily activities --- self-care, walking, climbing stairs- beginning the day after surgery.  Gradually increase activities as tolerated.  Control your pain to be active.  Stop when you are tired.  Ideally, walk several times a day, eventually an hour a day.   Most people are back to most day-to-day activities in a few weeks.  It takes 4-6 weeks to get back to unrestricted, intense activity. If you can walk 30 minutes without difficulty, it is safe to try more intense activity such as jogging, treadmill, bicycling, low-impact aerobics, swimming, etc. Save the most intensive and strenuous activity for last (Usually 4-8 weeks after surgery) such as sit-ups, heavy lifting, contact sports, etc.  Refrain from any intense heavy lifting or straining until you are off narcotics for pain control.  You will have off days, but things should improve week-by-week. DO NOT PUSH THROUGH PAIN.  Let pain be your guide: If it hurts to do something, don't do it.   Lifting restrictions   Complete by:  As directed    If you can walk 30 minutes without difficulty, it is safe to try more intense activity such as jogging, treadmill, bicycling, low-impact aerobics, swimming, etc. Save the most intensive and strenuous activity for  last (Usually 4-8 weeks after surgery) such as sit-ups, heavy lifting, contact sports, etc.   Refrain from any intense heavy lifting or straining until you are off narcotics for pain control.  You will have off days, but things should improve week-by-week. DO NOT PUSH THROUGH PAIN.  Let pain be your guide: If it hurts to do something, don't do it.  Pain is your body warning you to avoid that activity for another week until the pain goes down.   May shower / Bathe   Complete by:  As directed    May walk up steps   Complete by:  As directed    Sexual Activity Restrictions   Complete by:  As directed    You may have sexual intercourse when it is comfortable. If it hurts to do something, stop.      Allergies as of 10/14/2018   No Known Allergies     Medication List    TAKE these medications   amLODipine 5 MG tablet Commonly known as:  NORVASC Take 5 mg by mouth daily.   cholecalciferol 25 MCG (1000 UT) tablet Commonly known as:  VITAMIN D3 Take 1,000 Units by mouth daily.   losartan 100 MG tablet Commonly known as:  COZAAR Take 100 mg by mouth daily.   naproxen 500 MG tablet Commonly known as:  NAPROSYN Take 1 tablet (500 mg total) by mouth every 12 (twelve) hours as needed for moderate pain or headache.   traMADol 50 MG tablet Commonly known as:  ULTRAM Take 1-2 tablets (50-100 mg total) by mouth every 6 (six) hours as needed for moderate pain or severe pain.            Discharge Care Instructions  (From admission, onward)         Start     Ordered   10/14/18 0000  Discharge wound care:    Comments:  It is good for closed incisions to be washed every day.  Shower every day.  Short baths are fine.  Wash the incisions and wounds clean with soap & water.     You may leave closed incisions open to air if it is dry.   You may cover the incision with clean gauze & replace  it after your daily shower for comfort.   10/14/18 1000          Significant Diagnostic  Studies:  Results for orders placed or performed during the hospital encounter of 10/12/18 (from the past 72 hour(s))  Lactic acid, plasma     Status: None   Collection Time: 10/12/18  5:04 PM  Result Value Ref Range   Lactic Acid, Venous 1.4 0.5 - 1.9 mmol/L    Comment: Performed at [facility], [address] [place], [place] [address]  Comprehensive metabolic panel     Status: Abnormal   Collection Time: 10/12/18  5:04 PM  Result Value Ref Range   Sodium 133 (L) 135 - 145 mmol/L   Potassium 3.3 (L) 3.5 - 5.1 mmol/L   Chloride 95 (L) 98 - 111 mmol/L   CO2 26 22 - 32 mmol/L   Glucose, Bld 135 (H) 70 - 99 mg/dL   BUN 14 6 - 20 mg/dL   Creatinine, Ser 6.040.66 0.44 - 1.00 mg/dL   Calcium 8.7 (L) 8.9 - 10.3 mg/dL   Total Protein 8.0 6.5 - 8.1 g/dL   Albumin 4.0 3.5 - 5.0 g/dL   AST 26 15 - 41 U/L   ALT 27 0 - 44 U/L   Alkaline Phosphatase 88 38 - 126 U/L   Total Bilirubin 1.8 (H) 0.3 - 1.2 mg/dL   GFR calc non Af Amer >60 >60 mL/min   GFR calc Af Amer >60 >60 mL/min   Anion gap 12 5 - 15    Comment: Performed at [facility], [address] [place], [place] [address]  CBC WITH DIFFERENTIAL     Status: Abnormal   Collection Time: 10/12/18  5:04 PM  Result Value Ref Range   WBC 6.5 4.0 - 10.5 K/uL    Comment: REPEATED TO VERIFY WHITE COUNT CONFIRMED ON SMEAR    RBC 2.50 (L) 3.87 - 5.11 MIL/uL   Hemoglobin 6.6 (LL) 12.0 - 15.0 g/dL    Comment: REPEATED TO VERIFY THIS CRITICAL RESULT HAS VERIFIED AND BEEN CALLED TO SHEILA BIMGHAM,RN BY MARVIN SCOTTON ON 02 28 2020 AT 1822, AND HAS BEEN READ BACK.     HCT 22.2 (L) 36.0 - 46.0 %   MCV 88.8 80.0 - 100.0 fL   MCH 26.4 26.0 - 34.0 pg   MCHC 29.7 (L) 30.0 - 36.0 g/dL   RDW 11.914.2 14.711.5 - 82.915.5 %   Platelets 240 150 - 400 K/uL   nRBC 0.0 0.0 - 0.2 %   Neutrophils Relative % 84 %   Neutro Abs 5.5 1.7 - 7.7 K/uL   Lymphocytes Relative 6 %   Lymphs Abs 0.4 (L) 0.7 - 4.0 K/uL   Monocytes Relative 8  %   Monocytes Absolute 0.5 0.1 - 1.0 K/uL   Eosinophils Relative 1 %   Eosinophils Absolute 0.0 0.0 - 0.5 K/uL   Basophils Relative 0 %   Basophils Absolute 0.0 0.0 - 0.1 K/uL   WBC Morphology MORPHOLOGY UNREMARKABLE    Immature Granulocytes 1 %   Abs Immature Granulocytes 0.03 0.00 - 0.07 K/uL    Comment: Performed at [facility], [address] [place], [place] [address]  Lipase, blood     Status: None   Collection Time: 10/12/18  5:04 PM  Result Value Ref Range   Lipase 29 11 - 51 U/L    Comment: Performed at [facility], [address] [place], [place]  [address]  Magnesium     Status: None   Collection Time: 10/12/18  5:04 PM  Result Value Ref Range   Magnesium 2.2 1.7 - 2.4 mg/dL    Comment: Performed at [facility], [address], [place], [place] [address]  Blood Culture (routine x 2)     Status: None (Preliminary result)   Collection Time: 10/12/18  5:28 PM  Result Value Ref Range   Specimen Description      BLOOD RIGHT ANTECUBITAL Performed at [facility], [address] [place], [place] [address]    Special Requests      BOTTLES DRAWN AEROBIC AND ANAEROBIC Blood Culture results may not be optimal due to an excessive volume of blood received in culture bottles Performed at [facility], [address] [place], [place] [address]    Culture  Setup Time      GRAM NEGATIVE RODS ANAEROBIC BOTTLE ONLY CRITICAL RESULT CALLED TO, READ BACK BY AND VERIFIED WITH: Trixie Deis 0018 10/14/2018 Girtha Hake Performed at [facility], [address] [place], [place] [address]    Culture GRAM NEGATIVE RODS    Report Status PENDING   Blood Culture (routine x 2)     Status: None (Preliminary result)   Collection Time: 10/12/18  5:28 PM  Result Value Ref Range   Specimen Description      BLOOD BLOOD LEFT HAND Performed at [facility], [address] [place], [place] [address]    Special Requests      BOTTLES DRAWN AEROBIC AND ANAEROBIC Blood Culture results may not be optimal due to an excessive volume of blood received in culture bottles Performed at [facility], [address], [place], [place] [address]    Culture      NO GROWTH < 24 HOURS Performed at [facility], [address] [place], [place] [address]    Report Status PENDING   Blood Culture ID Panel (Reflexed)     Status: None   Collection Time: 10/12/18  5:28 PM  Result Value Ref Range   Enterococcus species NOT DETECTED NOT DETECTED   Listeria monocytogenes NOT DETECTED NOT DETECTED   Staphylococcus species NOT DETECTED NOT DETECTED   Staphylococcus aureus (BCID) NOT DETECTED NOT DETECTED   Streptococcus species NOT DETECTED NOT DETECTED   Streptococcus agalactiae NOT DETECTED NOT DETECTED   Streptococcus pneumoniae NOT DETECTED NOT DETECTED   Streptococcus pyogenes NOT DETECTED NOT DETECTED   Acinetobacter baumannii NOT DETECTED NOT DETECTED   Enterobacteriaceae species NOT DETECTED NOT DETECTED   Enterobacter cloacae complex NOT DETECTED NOT DETECTED   Escherichia coli NOT DETECTED NOT DETECTED   Klebsiella oxytoca NOT DETECTED NOT DETECTED   Klebsiella pneumoniae NOT DETECTED NOT DETECTED   Proteus species NOT DETECTED NOT DETECTED   Serratia marcescens NOT DETECTED NOT DETECTED   Haemophilus influenzae NOT DETECTED NOT DETECTED   Neisseria meningitidis NOT DETECTED NOT DETECTED   Pseudomonas aeruginosa NOT DETECTED NOT DETECTED   Candida albicans NOT DETECTED NOT DETECTED   Candida glabrata NOT DETECTED NOT DETECTED   Candida krusei NOT DETECTED NOT DETECTED   Candida parapsilosis NOT DETECTED NOT DETECTED   Candida tropicalis NOT DETECTED NOT DETECTED    Comment: Performed at [facility], [address], [place], [place] [address]  Urinalysis, Routine w reflex microscopic     Status: Abnormal   Collection Time: 10/12/18   6:16 PM  Result Value  Ref Range   Color, Urine YELLOW YELLOW   APPearance HAZY (A) CLEAR   Specific Gravity, Urine 1.011 1.005 - 1.030   pH 5.0 5.0 - 8.0   Glucose, UA NEGATIVE NEGATIVE mg/dL   Hgb urine dipstick LARGE (A) NEGATIVE   Bilirubin Urine NEGATIVE NEGATIVE   Ketones, ur NEGATIVE NEGATIVE mg/dL   Protein, ur 30 (A) NEGATIVE mg/dL   Nitrite NEGATIVE NEGATIVE   Leukocytes,Ua TRACE (A) NEGATIVE   RBC / HPF 0-5 0 - 5 RBC/hpf   WBC, UA 6-10 0 - 5 WBC/hpf   Bacteria, UA RARE (A) NONE SEEN   Squamous Epithelial / LPF 0-5 0 - 5   Mucus PRESENT     Comment: Performed at [facility], [address], [place], [place] [address]  Type and screen [facility]     Status: None (Preliminary result)   Collection Time: 10/12/18  6:33 PM  Result Value Ref Range   ABO/RH(D) O NEG    Antibody Screen NEG    Sample Expiration 10/15/2018    Unit Number [id number]    Blood Component Type RED CELLS,LR    Unit division 00    Status of Unit ISSUED,FINAL    Transfusion Status OK TO TRANSFUSE    Crossmatch Result Compatible    Unit Number [id number]    Blood Component Type RED CELLS,LR    Unit division 00    Status of Unit ISSUED    Transfusion Status OK TO TRANSFUSE    Crossmatch Result      Compatible Performed at [facility], [address], [place], [place] [address]   Prepare RBC     Status: None   Collection Time: 10/12/18  6:33 PM  Result Value Ref Range   Order Confirmation      ORDER PROCESSED BY BLOOD BANK Performed at [facility], [address] [place], [place] [address]   Lactic acid, plasma     Status: None   Collection Time: 10/12/18  9:04 PM  Result Value Ref Range   Lactic Acid, Venous 1.1 0.5 - 1.9 mmol/L    Comment: Performed at [facility], [address], [place], [place] [address]  CBC     Status: Abnormal   Collection Time: 10/13/18  6:08 AM  Result Value  Ref Range   WBC 6.3 4.0 - 10.5 K/uL   RBC 2.60 (L) 3.87 - 5.11 MIL/uL   Hemoglobin 7.3 (L) 12.0 - 15.0 g/dL   HCT 44.0 (L) 10.2 - 72.5 %   MCV 89.6 80.0 - 100.0 fL   MCH 28.1 26.0 - 34.0 pg   MCHC 31.3 30.0 - 36.0 g/dL   RDW 36.6 44.0 - 34.7 %   Platelets 204 150 - 400 K/uL   nRBC 0.0 0.0 - 0.2 %    Comment: Performed at [facility], [address], [place], [place] [address]  Magnesium     Status: None   Collection Time: 10/13/18  6:08 AM  Result Value Ref Range   Magnesium 2.2 1.7 - 2.4 mg/dL    Comment: Performed at [facility], [address] [place], [place] [address]  Potassium     Status: None   Collection Time: 10/13/18  6:08 AM  Result Value Ref Range   Potassium 3.5 3.5 - 5.1 mmol/L    Comment: Performed at [facility], [address] [place], [place] [address]  HIV antibody (  Routine Testing)     Status: None   Collection Time: 10/13/18  6:08 AM  Result Value Ref Range   HIV Screen 4th Generation wRfx Non Reactive Non Reactive    Comment: (NOTE) Performed At: [facility] [address] [place], [place] [id number] Jolene Schimke MD WU:[id number]   Prepare RBC     Status: None   Collection Time: 10/13/18  7:50 AM  Result Value Ref Range   Order Confirmation      ORDER PROCESSED BY BLOOD BANK Performed at [facility], [address] [place], [place] [address]   CBC     Status: Abnormal   Collection Time: 10/14/18  9:12 AM  Result Value Ref Range   WBC 8.8 4.0 - 10.5 K/uL   RBC 3.22 (L) 3.87 - 5.11 MIL/uL   Hemoglobin 9.0 (L) 12.0 - 15.0 g/dL   HCT 30.8 (L) 65.7 - 84.6 %   MCV 87.9 80.0 - 100.0 fL   MCH 28.0 26.0 - 34.0 pg   MCHC 31.8 30.0 - 36.0 g/dL   RDW 96.2 95.2 - 84.1 %   Platelets 256 150 - 400 K/uL   nRBC 0.0 0.0 - 0.2 %    Comment: Performed at [facility], [address] [place], [place] [address]    Dg Chest 2 View  Result Date:  10/12/2018 CLINICAL DATA:  53 y/o F; fever, evaluate for pneumonia. 09/16/2018 hernia repair. EXAM: CHEST - 2 VIEW COMPARISON:  05/09/2017 chest radiograph FINDINGS: Stable heart size and mediastinal contours are within normal limits. Both lungs are clear. The visualized skeletal structures are unremarkable. IMPRESSION: No acute pulmonary process identified. Electronically Signed   By: Mitzi Hansen M.D.   On: 10/12/2018 18:59    Past Medical History:  Diagnosis Date  . Abnormal pap   . Cervicitis   . Endometrial polyp   . Hypertension   . Ovarian cyst, right     Past Surgical History:  Procedure Laterality Date  . CESAREAN SECTION  05/12/1998  . CESAREAN SECTION  11/04/1999  . CESAREAN SECTION  02/09/2006  . COLPOSCOPY  10/13/03  . INGUINAL HERNIA REPAIR Right 10/04/2018   Procedure: LAPAROSCOPIC BILATERAL INGUINAL HERNIA REPAIR WITH MESH INCISON OF CYST MASS RIGHT ROUND LIGAMENT WITH TAP BLOCK;  Surgeon: Karie Soda, MD;  Location: [facility];  Service: General;  Laterality: Right;  ERAS PATHWAY    Social History   Socioeconomic History  . Marital status: Married    Spouse name: Not on file  . Number of children: Not on file  . Years of education: Not on file  . Highest education level: Not on file  Occupational History  . Not on file  Social Needs  . Financial resource strain: Not on file  . Food insecurity:    Worry: Not on file    Inability: Not on file  . Transportation needs:    Medical: Not on file    Non-medical: Not on file  Tobacco Use  . Smoking status: Never Smoker  . Smokeless tobacco: Never Used  Substance and Sexual Activity  . Alcohol use: No  . Drug use: No  . Sexual activity: Yes    Birth control/protection: None  Lifestyle  . Physical activity:    Days per week: Not on file    Minutes per session: Not on file  . Stress: Not on file  Relationships  . Social connections:    Talks on phone: Not on file  Gets together: Not on file     Attends religious service: Not on file    Active member of club or organization: Not on file    Attends meetings of clubs or organizations: Not on file    Relationship status: Not on file  . Intimate partner violence:    Fear of current or ex partner: Not on file    Emotionally abused: Not on file    Physically abused: Not on file    Forced sexual activity: Not on file  Other Topics Concern  . Not on file  Social History Narrative  . Not on file    Family History  Problem Relation Age of Onset  . Congestive Heart Failure Mother   . Breast cancer Sister   . Colon cancer Paternal Aunt     Current Facility-Administered Medications  Medication Dose Route Frequency Provider Last Rate Last Dose  . 0.9 %  sodium chloride infusion (Manually program via Guardrails IV Fluids)   Intravenous Once Karie Soda, MD      . acetaminophen (TYLENOL) tablet 1,000 mg  1,000 mg Oral TID Karie Soda, MD   1,000 mg at 10/13/18 2133  . alum & mag hydroxide-simeth (MAALOX/MYLANTA) 200-200-20 MG/5ML suspension 30 mL  30 mL Oral Q6H PRN Karie Soda, MD      . bisacodyl (DULCOLAX) suppository 10 mg  10 mg Rectal Q12H PRN Karie Soda, MD      . diphenhydrAMINE (BENADRYL) injection 12.5-25 mg  12.5-25 mg Intravenous Q6H PRN Karie Soda, MD      . gabapentin (NEURONTIN) capsule 300 mg  300 mg Oral Laurena Slimmer, MD   300 mg at 10/13/18 2133  . guaiFENesin-dextromethorphan (ROBITUSSIN DM) 100-10 MG/5ML syrup 10 mL  10 mL Oral Q4H PRN Karie Soda, MD      . hydrocortisone (ANUSOL-HC) 2.5 % rectal cream 1 application  1 application Topical QID PRN Karie Soda, MD      . hydrocortisone cream 1 % 1 application  1 application Topical TID PRN Karie Soda, MD      . lactated ringers bolus 1,000 mL  1,000 mL Intravenous Q8H PRN Charnae Lill, Viviann Spare, MD      . lip balm (CARMEX) ointment 1 application  1 application Topical BID Karie Soda, MD   1 application at 10/13/18 2134  . LORazepam (ATIVAN) injection  0.5-1 mg  0.5-1 mg Intravenous Q8H PRN Karie Soda, MD      . magic mouthwash  15 mL Oral QID PRN Karie Soda, MD      . menthol-cetylpyridinium (CEPACOL) lozenge 3 mg  1 lozenge Oral PRN Karie Soda, MD      . methocarbamol (ROBAXIN) 1,000 mg in dextrose 5 % 50 mL IVPB  1,000 mg Intravenous Q6H PRN Karie Soda, MD      . methocarbamol (ROBAXIN) tablet 1,000 mg  1,000 mg Oral Q6H PRN Karie Soda, MD      . naproxen (NAPROSYN) tablet 500 mg  500 mg Oral Q12H PRN Karie Soda, MD   500 mg at 10/14/18 0336  . ondansetron (ZOFRAN) 8 mg in sodium chloride 0.9 % 50 mL IVPB  8 mg Intravenous Q6H PRN Karie Soda, MD      . ondansetron (ZOFRAN-ODT) disintegrating tablet 4 mg  4 mg Oral Q6H PRN Karie Soda, MD       Or  . ondansetron (ZOFRAN) injection 4 mg  4 mg Intravenous Q6H PRN Karie Soda, MD      . phenol (CHLORASEPTIC) mouth  spray 1-2 spray  1-2 spray Mouth/Throat PRN Karie Soda, MD      . polyethylene glycol (MIRALAX / GLYCOLAX) packet 17 g  17 g Oral Q12H PRN Karie Soda, MD      . prochlorperazine (COMPAZINE) injection 5-10 mg  5-10 mg Intravenous Q4H PRN Karie Soda, MD      . psyllium (HYDROCIL/METAMUCIL) packet 1 packet  1 packet Oral BID Karie Soda, MD   1 packet at 10/13/18 2133  . simethicone (MYLICON) chewable tablet 40 mg  40 mg Oral Q6H PRN Karie Soda, MD      . traMADol Janean Sark) tablet 50-100 mg  50-100 mg Oral Q6H PRN Karie Soda, MD   50 mg at 10/14/18 0459  . vitamin C (ASCORBIC ACID) tablet 500 mg  500 mg Oral BID Karie Soda, MD   500 mg at 10/13/18 2133     No Known Allergies  Signed: Lorenso Courier, MD, FACS, MASCRS Gastrointestinal and [facility]ally In[facility] Surgery    [address] [place], [place] [address] [phone] Main / Paging [phone] Fax   10/14/2018, 10:00 AM

## 2018-10-14 NOTE — Progress Notes (Signed)
Discharge instructions discussed with patient and family, verbalized agreement and understanding 

## 2018-10-14 NOTE — Discharge Instructions (Signed)
HERNIA REPAIR: POST OP INSTRUCTIONS  ######################################################################  EAT Gradually transition to a high fiber diet with a fiber supplement over the next few weeks after discharge.  Start with a pureed / full liquid diet (see below)  EXERCISE Walk an hour a day.  Control your pain to do that.   Right hip flexion and abduction exercises to help strengthen those muscles until your obturator nerve can recover from the    CONTROL PAIN Control pain so that you can walk, sleep, tolerate sneezing/coughing, and go up/down stairs.  HAVE A BOWEL MOVEMENT DAILY Keep your bowels regular to avoid problems.  OK to try a laxative to override constipation.  OK to use an antidairrheal to slow down diarrhea.  Call if not better after 2 tries  CALL IF YOU HAVE PROBLEMS/CONCERNS Call if you are still struggling despite following these instructions. Call if you have concerns not answered by these instructions  ######################################################################    1. DIET: Follow a light bland diet the first 24 hours after arrival home, such as soup, liquids, crackers, etc.  Be sure to include lots of fluids daily.  Advance to a low fat / high fiber diet over the next few days after surgery.  Avoid fast food or heavy meals the first week as your are more likely to get nauseated.    2. Take your usually prescribed home medications unless otherwise directed.  3. PAIN CONTROL: a. Pain is best controlled by a usual combination of three different methods TOGETHER: i. Ice/Heat ii. Over the counter pain medication iii. Prescription pain medication b. Most patients will experience some swelling and bruising around the hernia(s) such as the bellybutton, groins, or old incisions.  Ice packs or heating pads (30-60 minutes up to 6 times a day) will help. Use ice for the first few days to help decrease swelling and bruising, then switch to heat to help  relax tight/sore spots and speed recovery.  Some people prefer to use ice alone, heat alone, alternating between ice & heat.  Experiment to what works for you.  Swelling and bruising can take several weeks to resolve.   c. It is helpful to take an over-the-counter pain medication regularly for the first few weeks.  Choose one of the following that works best for you: i. Naproxen (Aleve, etc)  Two 220mg  tabs twice a day ii. Ibuprofen (Advil, etc) Three 200mg  tabs four times a day (every meal & bedtime) iii. Acetaminophen (Tylenol, etc) 325-650mg  four times a day (every meal & bedtime) d. A  prescription for pain medication should be given to you upon discharge.  Take your pain medication as prescribed.  i. If you are having problems/concerns with the prescription medicine (does not control pain, nausea, vomiting, rash, itching, etc), please call us [phone] to see if we need to switch you to a different pain medicine that will work better for you and/or control your side effect better. ii. If you need a refill on your pain medication, please contact your pharmacy.  They will contact our office to request authorization. Prescriptions will not be filled after 5 pm or on week-ends.  4. Avoid getting constipated.  Between the surgery and the pain medications, it is common to experience some constipation.  Increasing fluid intake and taking a fiber supplement (such as Metamucil, Citrucel, FiberCon, MiraLax, etc) 1-2 times a day regularly will usually help prevent this problem from occurring.  A mild laxative (prune juice, Milk of Magnesia, MiraLax, etc) should be taken  according to package directions if there are no bowel movements after 48 hours.    5. Wash / shower every day.  You may shower over the dressings as they are waterproof.    6. Remove your waterproof bandages, skin tapes, and other bandages 5 days after surgery. You may replace a dressing/Band-Aid to cover the incision for comfort if you  wish. You may leave the incisions open to air.  You may replace a dressing/Band-Aid to cover an incision for comfort if you wish.  Continue to shower over incision(s) after the dressing is off.  7. ACTIVITIES as tolerated:   a. You may resume regular (light) daily activities beginning the next day--such as daily self-care, walking, climbing stairs--gradually increasing activities as tolerated.  Control your pain so that you can walk an hour a day.  If you can walk 30 minutes without difficulty, it is safe to try more intense activity such as jogging, treadmill, bicycling, low-impact aerobics, swimming, etc. b. Save the most intensive and strenuous activity for last such as sit-ups, heavy lifting, contact sports, etc  Refrain from any heavy lifting or straining until you are off narcotics for pain control.   c. DO NOT PUSH THROUGH PAIN.  Let pain be your guide: If it hurts to do something, don't do it.  Pain is your body warning you to avoid that activity for another week until the pain goes down. d. You may drive when you are no longer taking prescription pain medication, you can comfortably wear a seatbelt, and you can safely maneuver your car and apply brakes. e. Bonita Quin may have sexual intercourse when it is comfortable.   8. FOLLOW UP in our office a. Please call [facility] at [phone] to set up an appointment to see your surgeon in the office for a follow-up appointment approximately 2-3 weeks after your surgery. b. Make sure that you call for this appointment the day you arrive home to insure a convenient appointment time.  9.  If you have disability of FMLA / Family leave forms, please bring the forms to the office for processing.  (do not give to your surgeon).  WHEN TO CALL us [phone]: 1. Poor pain control 2. Reactions / problems with new medications (rash/itching, nausea, etc)  3. Fever over 101.5 F (38.5 C) 4. Inability to urinate 5. Nausea and/or vomiting 6. Worsening swelling or  bruising 7. Continued bleeding from incision. 8. Increased pain, redness, or drainage from the incision   The clinic staff is available to answer your questions during regular business hours (8:30am-5pm).  Please dont hesitate to call and ask to speak to one of our nurses for clinical concerns.   If you have a medical emergency, go to the nearest emergency room or call 911.  A surgeon from [facility] is always on call at the hospitals in [facility], [place] [address], [address], [place], [place]  [address] ?  [address] [address], [place], [place]   [address] MAIN: [phone] ? TOLL FREE: [phone] ? FAX: [phone] [email or web address]   Hip: Flexion / Adduction / Mobilization    Right side toward pulley, strap above knee, leg out to side, lift leg until thigh is parallel to floor, knee bent 90, pull across body. Repeat ____ times per set. Do ____ sets per session. Do ____ sessions per week. Use ____ lb weights.  Copyright  VHI. All rights reserved.

## 2018-10-15 LAB — BPAM RBC
BLOOD PRODUCT EXPIRATION DATE: 202004012359
Blood Product Expiration Date: 202004022359
ISSUE DATE / TIME: 202002282312
ISSUE DATE / TIME: 202002291458
Unit Type and Rh: 9500
Unit Type and Rh: 9500

## 2018-10-15 LAB — TYPE AND SCREEN
ABO/RH(D): O NEG
Antibody Screen: NEGATIVE
Unit division: 0
Unit division: 0

## 2018-10-16 LAB — CULTURE, BLOOD (ROUTINE X 2)

## 2018-10-17 LAB — CULTURE, BLOOD (ROUTINE X 2): Culture: NO GROWTH

## 2018-10-17 MED FILL — traMADol HCL 50 MG TABS: 50 | 10 days supply | Qty: 40 | Fill #0

## 2018-10-24 MED FILL — oxyCODONE HCL 5 MG TABS: 5 | 5 days supply | Qty: 40 | Fill #0

## 2018-10-25 ENCOUNTER — Other Ambulatory Visit: Payer: Self-pay | Admitting: Surgery

## 2018-10-25 DIAGNOSIS — N9489 Other specified conditions associated with female genital organs and menstrual cycle: Secondary | ICD-10-CM

## 2018-10-27 ENCOUNTER — Other Ambulatory Visit: Payer: Self-pay

## 2018-10-27 ENCOUNTER — Encounter (HOSPITAL_COMMUNITY): Payer: Self-pay

## 2018-10-27 ENCOUNTER — Inpatient Hospital Stay (HOSPITAL_COMMUNITY)
Admission: EM | Admit: 2018-10-27 | Discharge: 2018-10-30 | DRG: 372 | Disposition: A | Discharge status: Home / Self Care | Payer: 59 | Attending: Surgery | Admitting: Surgery

## 2018-10-27 ENCOUNTER — Ambulatory Visit
Admission: RE | Admit: 2018-10-27 | Discharge: 2018-10-27 | Disposition: A | Discharge status: Home / Self Care | Payer: 59 | Source: Ambulatory Visit | Attending: Surgery | Admitting: Surgery

## 2018-10-27 DIAGNOSIS — I10 Essential (primary) hypertension: Secondary | ICD-10-CM | POA: Diagnosis not present

## 2018-10-27 DIAGNOSIS — K651 Peritoneal abscess: Secondary | ICD-10-CM | POA: Diagnosis not present

## 2018-10-27 DIAGNOSIS — K4091 Unilateral inguinal hernia, without obstruction or gangrene, recurrent: Secondary | ICD-10-CM | POA: Diagnosis not present

## 2018-10-27 DIAGNOSIS — N739 Female pelvic inflammatory disease, unspecified: Secondary | ICD-10-CM | POA: Diagnosis not present

## 2018-10-27 DIAGNOSIS — D62 Acute posthemorrhagic anemia: Secondary | ICD-10-CM | POA: Diagnosis not present

## 2018-10-27 DIAGNOSIS — N9489 Other specified conditions associated with female genital organs and menstrual cycle: Secondary | ICD-10-CM

## 2018-10-27 DIAGNOSIS — K6811 Postprocedural retroperitoneal abscess: Secondary | ICD-10-CM | POA: Diagnosis not present

## 2018-10-27 DIAGNOSIS — R319 Hematuria, unspecified: Secondary | ICD-10-CM | POA: Diagnosis present

## 2018-10-27 LAB — URINALYSIS, ROUTINE W REFLEX MICROSCOPIC
Bilirubin Urine: NEGATIVE
Glucose, UA: NEGATIVE mg/dL
Ketones, ur: NEGATIVE mg/dL
Leukocytes,Ua: NEGATIVE
NITRITE: NEGATIVE
Protein, ur: 30 mg/dL — AB
Specific Gravity, Urine: 1.021 (ref 1.005–1.030)
pH: 5 (ref 5.0–8.0)

## 2018-10-27 LAB — COMPREHENSIVE METABOLIC PANEL
ALBUMIN: 2.6 g/dL — AB (ref 3.5–5.0)
ALT: 37 U/L (ref 0–44)
AST: 25 U/L (ref 15–41)
Alkaline Phosphatase: 154 U/L — ABNORMAL HIGH (ref 38–126)
Anion gap: 12 (ref 5–15)
BUN: 16 mg/dL (ref 6–20)
CO2: 25 mmol/L (ref 22–32)
Calcium: 9.3 mg/dL (ref 8.9–10.3)
Chloride: 96 mmol/L — ABNORMAL LOW (ref 98–111)
Creatinine, Ser: 1.15 mg/dL — ABNORMAL HIGH (ref 0.44–1.00)
GFR calc Af Amer: >60 mL/min (ref >60)
GFR calc non Af Amer: 55 mL/min — ABNORMAL LOW (ref >60)
GLUCOSE: 142 mg/dL — AB (ref 70–99)
POTASSIUM: 3.5 mmol/L (ref 3.5–5.1)
Sodium: 133 mmol/L — ABNORMAL LOW (ref 135–145)
TOTAL PROTEIN: 7.4 g/dL (ref 6.5–8.1)
Total Bilirubin: 0.9 mg/dL (ref 0.3–1.2)

## 2018-10-27 LAB — CBC WITH DIFFERENTIAL/PLATELET
Abs Immature Granulocytes: 0.06 10*3/uL (ref 0.00–0.07)
Basophils Absolute: 0 10*3/uL (ref 0.0–0.1)
Basophils Relative: 0 %
Eosinophils Absolute: 0.1 10*3/uL (ref 0.0–0.5)
Eosinophils Relative: 1 %
HCT: 26.6 % — ABNORMAL LOW (ref 36.0–46.0)
Hemoglobin: 8.1 g/dL — ABNORMAL LOW (ref 12.0–15.0)
Immature Granulocytes: 1 %
Lymphocytes Relative: 8 %
Lymphs Abs: 1 10*3/uL (ref 0.7–4.0)
MCH: 26 pg (ref 26.0–34.0)
MCHC: 30.5 g/dL (ref 30.0–36.0)
MCV: 85.5 fL (ref 80.0–100.0)
Monocytes Absolute: 1 10*3/uL (ref 0.1–1.0)
Monocytes Relative: 8 %
NEUTROS ABS: 9.8 10*3/uL — AB (ref 1.7–7.7)
Neutrophils Relative %: 82 %
Platelets: 569 10*3/uL — ABNORMAL HIGH (ref 150–400)
RBC: 3.11 MIL/uL — ABNORMAL LOW (ref 3.87–5.11)
RDW: 15.5 % (ref 11.5–15.5)
WBC: 11.9 10*3/uL — ABNORMAL HIGH (ref 4.0–10.5)
nRBC: 0 % (ref 0.0–0.2)

## 2018-10-27 LAB — LACTIC ACID, PLASMA
Lactic Acid, Venous: 1.9 mmol/L (ref 0.5–1.9)
Lactic Acid, Venous: 1.2 mmol/L (ref 0.5–1.9)

## 2018-10-27 MED ORDER — ACETAMINOPHEN 325 MG PO TABS
650.0000 mg | ORAL_TABLET | Freq: Four times a day (QID) | ORAL | Status: DC | PRN
  Administered 2018-10-27 – 2018-10-28 (×3): 650 mg via ORAL
  Filled 2018-10-27 (×3): qty 2

## 2018-10-27 MED ORDER — ONDANSETRON 4 MG PO TBDP: 4.0000 mg | ORAL_TABLET | Freq: Four times a day (QID) | ORAL | Status: DC | PRN

## 2018-10-27 MED ORDER — HYDROMORPHONE HCL 1 MG/ML IJ SOLN
1.0000 mg | INTRAMUSCULAR | Status: DC | PRN
  Administered 2018-10-27 – 2018-10-30 (×10): 1 mg via INTRAVENOUS
  Filled 2018-10-27 (×10): qty 1

## 2018-10-27 MED ORDER — PIPERACILLIN-TAZOBACTAM 3.375 G IVPB: 3.3750 g | Freq: Three times a day (TID) | INTRAVENOUS | Status: DC

## 2018-10-27 MED ORDER — SODIUM CHLORIDE 0.9 % IV BOLUS
1000.0000 mL | Freq: Once | INTRAVENOUS | Status: AC
  Administered 2018-10-27: 1000 mL via INTRAVENOUS

## 2018-10-27 MED ORDER — PIPERACILLIN-TAZOBACTAM 3.375 G IVPB
3.3750 g | Freq: Three times a day (TID) | INTRAVENOUS | Status: DC
  Administered 2018-10-27 – 2018-10-30 (×8): 3.375 g via INTRAVENOUS
  Filled 2018-10-27 (×8): qty 50

## 2018-10-27 MED ORDER — GADOBENATE DIMEGLUMINE 529 MG/ML IV SOLN
15.0000 mL | Freq: Once | INTRAVENOUS | Status: AC | PRN
  Administered 2018-10-27: 15 mL via INTRAVENOUS

## 2018-10-27 MED ORDER — OXYCODONE HCL 5 MG PO TABS
5.0000 mg | ORAL_TABLET | ORAL | Status: DC | PRN
  Administered 2018-10-27 – 2018-10-30 (×11): 10 mg via ORAL
  Filled 2018-10-27 (×12): qty 2

## 2018-10-27 MED ORDER — DEXTROSE-NACL 5-0.9 % IV SOLN
INTRAVENOUS | Status: DC
  Administered 2018-10-27 – 2018-10-30 (×7): via INTRAVENOUS

## 2018-10-27 MED ORDER — ONDANSETRON HCL 4 MG/2ML IJ SOLN: 4.0000 mg | Freq: Four times a day (QID) | INTRAMUSCULAR | Status: DC | PRN

## 2018-10-27 MED ORDER — PIPERACILLIN-TAZOBACTAM 3.375 G IVPB 30 MIN
3.3750 g | Freq: Once | INTRAVENOUS | Status: AC
  Administered 2018-10-27: 3.375 g via INTRAVENOUS
  Filled 2018-10-27: qty 50

## 2018-10-27 NOTE — ED Notes (Signed)
ED TO INPATIENT HANDOFF REPORT  ED Nurse Name and Phone #: Lanora Manis [phone]  S Name/Age/Gender [name] 53 y.o. female Room/Bed: 045C/045C  Code Status   Code Status: Full Code  Home/SNF/Other Home Patient oriented to: self, place, time and situation Is this baseline? Yes   Triage Complete: Triage complete  Chief Complaint hematoma groin abscess  Triage Note Pt sent from [facility] imaging for evaluation of hematoma groin abscess.  Pt had recent hernia repair. Pt states pain is 5/10.  Pt self administered 2 oxycodone at 1205 today.     Allergies No Known Allergies  Level of Care/Admitting Diagnosis ED Disposition    ED Disposition Condition Comment   Admit  Hospital Area: [facility] [[id number]]  Level of Care: Med-Surg [16]  Diagnosis: Intra-abdominal abscess (HCC) [301066]  Admitting Physician: Aris Lot  Attending Physician: Karie Soda [[id number]]  Bed request comments: 6N  PT Class (Do Not Modify): Observation [104]  PT Acc Code (Do Not Modify): Observation [10022]       B Medical/Surgery History Past Medical History:  Diagnosis Date  . Hypertension    Past Surgical History:  Procedure Laterality Date  . HERNIA REPAIR       A IV Location/Drains/Wounds Patient Lines/Drains/Airways Status   Active Line/Drains/Airways    Name:   Placement date:   Placement time:   Site:   Days:   Peripheral IV 10/27/18 Left Antecubital   10/27/18    1427    Antecubital   less than 1          Intake/Output Last 24 hours  Intake/Output Summary (Last 24 hours) at 10/27/2018 1650 Last data filed at 10/27/2018 1646 Gross per 24 hour  Intake 248.63 ml  Output -  Net 248.63 ml    Labs/Imaging Results for orders placed or performed during the hospital encounter of 10/27/18 (from the past 48 hour(s))  CBC with Differential     Status: Abnormal   Collection Time: 10/27/18  2:28 PM  Result Value Ref Range   WBC 11.9 (H) 4.0 -  10.5 K/uL   RBC 3.11 (L) 3.87 - 5.11 MIL/uL   Hemoglobin 8.1 (L) 12.0 - 15.0 g/dL   HCT 49.8 (L) 26.4 - 15.8 %   MCV 85.5 80.0 - 100.0 fL   MCH 26.0 26.0 - 34.0 pg   MCHC 30.5 30.0 - 36.0 g/dL   RDW 30.9 40.7 - 68.0 %   Platelets 569 (H) 150 - 400 K/uL   nRBC 0.0 0.0 - 0.2 %   Neutrophils Relative % 82 %   Neutro Abs 9.8 (H) 1.7 - 7.7 K/uL   Lymphocytes Relative 8 %   Lymphs Abs 1.0 0.7 - 4.0 K/uL   Monocytes Relative 8 %   Monocytes Absolute 1.0 0.1 - 1.0 K/uL   Eosinophils Relative 1 %   Eosinophils Absolute 0.1 0.0 - 0.5 K/uL   Basophils Relative 0 %   Basophils Absolute 0.0 0.0 - 0.1 K/uL   WBC Morphology See Note     Comment: Toxic Granulation  Vaculated Neutrophils Dohle Bodies    Immature Granulocytes 1 %   Abs Immature Granulocytes 0.06 0.00 - 0.07 K/uL    Comment: Performed at [facility], [address], [place], [place] [address]  Comprehensive metabolic panel     Status: Abnormal   Collection Time: 10/27/18  2:28 PM  Result Value Ref Range   Sodium 133 (L) 135 - 145 mmol/L   Potassium  3.5 3.5 - 5.1 mmol/L   Chloride 96 (L) 98 - 111 mmol/L   CO2 25 22 - 32 mmol/L   Glucose, Bld 142 (H) 70 - 99 mg/dL   BUN 16 6 - 20 mg/dL   Creatinine, Ser 8.52 (H) 0.44 - 1.00 mg/dL   Calcium 9.3 8.9 - 77.8 mg/dL   Total Protein 7.4 6.5 - 8.1 g/dL   Albumin 2.6 (L) 3.5 - 5.0 g/dL   AST 25 15 - 41 U/L   ALT 37 0 - 44 U/L   Alkaline Phosphatase 154 (H) 38 - 126 U/L   Total Bilirubin 0.9 0.3 - 1.2 mg/dL   GFR calc non Af Amer 55 (L) >60 mL/min   GFR calc Af Amer >60 >60 mL/min   Anion gap 12 5 - 15    Comment: Performed at [facility], [address] [place], [place] [address]  Lactic acid, plasma     Status: None   Collection Time: 10/27/18  2:28 PM  Result Value Ref Range   Lactic Acid, Venous 1.9 0.5 - 1.9 mmol/L    Comment: Performed at [facility], [address] [place], [place] [address]  Urinalysis, Routine w reflex microscopic     Status:  Abnormal   Collection Time: 10/27/18  2:44 PM  Result Value Ref Range   Color, Urine YELLOW YELLOW   APPearance HAZY (A) CLEAR   Specific Gravity, Urine 1.021 1.005 - 1.030   pH 5.0 5.0 - 8.0   Glucose, UA NEGATIVE NEGATIVE mg/dL   Hgb urine dipstick MODERATE (A) NEGATIVE   Bilirubin Urine NEGATIVE NEGATIVE   Ketones, ur NEGATIVE NEGATIVE mg/dL   Protein, ur 30 (A) NEGATIVE mg/dL   Nitrite NEGATIVE NEGATIVE   Leukocytes,Ua NEGATIVE NEGATIVE   RBC / HPF 0-5 0 - 5 RBC/hpf   WBC, UA 11-20 0 - 5 WBC/hpf   Bacteria, UA RARE (A) NONE SEEN   Squamous Epithelial / LPF 6-10 0 - 5   Hyaline Casts, UA PRESENT     Comment: Performed at [facility], [address] [place], [place] [address]   No results found.  Pending Labs Unresulted Labs (From admission, onward)    Start     Ordered   10/28/18 0500  CBC  Tomorrow morning,   R     10/27/18 1612   10/27/18 1444  Culture, blood (routine x 2)  BLOOD CULTURE X 2,   STAT     10/27/18 1444   10/27/18 1444  Lactic acid, plasma  Now then every 2 hours,   STAT     10/27/18 1444          Vitals/Pain Today's Vitals   10/27/18 1430 10/27/18 1515 10/27/18 1543 10/27/18 1645  BP: 112/67 110/68  107/70  Pulse: (!) 110 (!) 110  (!) 109  Resp:      Temp:      TempSrc:      SpO2: 100% 100%  99%  PainSc:   6      Isolation Precautions No active isolations  Medications Medications  dextrose 5 %-0.9 % sodium chloride infusion (has no administration in time range)  oxyCODONE (Oxy IR/ROXICODONE) immediate release tablet 5-10 mg (has no administration in time range)  HYDROmorphone (DILAUDID) injection 1 mg (has no administration in time range)  ondansetron (ZOFRAN-ODT) disintegrating tablet 4 mg (has no administration in time range)    Or  ondansetron (ZOFRAN) injection 4 mg (has no administration  in time range)  sodium chloride 0.9 % bolus 1,000 mL (0 mLs Intravenous Stopped 10/27/18 1646)  piperacillin-tazobactam (ZOSYN) IVPB 3.375  g (0 g Intravenous Stopped 10/27/18 1613)    Mobility walks Low fall risk   Focused Assessments    R Recommendations: See Admitting Provider Note  Report given to:   Additional Notes:

## 2018-10-27 NOTE — ED Triage Notes (Signed)
Pt sent from [facility] imaging for evaluation of hematoma groin abscess.  Pt had recent hernia repair. Pt states pain is 5/10.  Pt self administered 2 oxycodone at 1205 today.

## 2018-10-27 NOTE — ED Provider Notes (Signed)
[facility] EMERGENCY DEPARTMENT Provider Note   CSN: [id number] Arrival date & time: 10/27/18  1357    History   Chief Complaint No chief complaint on file.   HPI [name] is a 53 y.o. female.     HPI  Patient is a 54 year old female with a past medical history of bilateral inguinal hernias status post repair per Dr. gross on 10-04-2018 presenting for abnormal MRI and right lower abdominal pain.  Patient reports that she had a complicated postsurgical course with anemia and prolonged pain.  She received 2 units PRBCs on 10-04-2018.  She reports that ever since then she has had progressively worsening right lower quadrant pain she can barely get out of bed.  She reports that she requested an MRI be performed.  MRI pelvis performed showing a large abscess 17 x 12 x 12 cm in the right lower quadrant and right pelvis, causing mass-effect on uterus and bladder.  Patient reports that she was febrile 1 week ago but this has resolved.  No nausea, vomiting, or urinary symptoms.  sNo past medical history on file.  There are no active problems to display for this patient.      OB History   No obstetric history on file.      Home Medications    Prior to Admission medications   Not on File    Family History No family history on file.  Social History Social History   Tobacco Use  . Smoking status: Not on file  Substance Use Topics  . Alcohol use: Not on file  . Drug use: Not on file     Allergies   Patient has no allergy information on record.   Review of Systems Review of Systems  Constitutional: Negative for chills and fever.  HENT: Negative for congestion and rhinorrhea.   Respiratory: Negative for shortness of breath.   Cardiovascular: Negative for chest pain.  Gastrointestinal: Positive for abdominal pain. Negative for nausea and vomiting.  Genitourinary: Positive for pelvic pain. Negative for flank pain.  All other systems  reviewed and are negative.    Physical Exam Updated Vital Signs BP 110/68   Pulse (!) 110   Temp (!) 97.4 F (36.3 C) (Oral)   Resp 16   SpO2 100%   Physical Exam Vitals signs and nursing note reviewed.  Constitutional:      General: She is not in acute distress.    Appearance: She is well-developed.  HENT:     Head: Normocephalic and atraumatic.  Eyes:     Conjunctiva/sclera: Conjunctivae normal.     Pupils: Pupils are equal, round, and reactive to light.  Neck:     Musculoskeletal: Normal range of motion and neck supple.  Cardiovascular:     Rate and Rhythm: Normal rate and regular rhythm.     Heart sounds: S1 normal and S2 normal. No murmur.  Pulmonary:     Effort: Pulmonary effort is normal.     Breath sounds: Normal breath sounds. No wheezing or rales.  Abdominal:     General: There is no distension.     Palpations: Abdomen is soft.     Tenderness: There is abdominal tenderness. There is no guarding.     Comments: Patient has right lower quadrant tenderness without guarding or rebound.  Patient has a firmness to palpation of the right lower abdomen compared to left.  Mass palpated in the right lower quadrant.  Musculoskeletal: Normal range of motion.  General: No deformity.  Lymphadenopathy:     Cervical: No cervical adenopathy.  Skin:    General: Skin is warm and dry.     Findings: No erythema or rash.  Neurological:     Mental Status: She is alert.     Comments: Cranial nerves grossly intact. Patient moves extremities symmetrically and with good coordination.  Psychiatric:        Behavior: Behavior normal.        Thought Content: Thought content normal.        Judgment: Judgment normal.      ED Treatments / Results  Labs (all labs ordered are listed, but only abnormal results are displayed) Labs Reviewed  CBC WITH DIFFERENTIAL/PLATELET - Abnormal; Notable for the following components:      Result Value   WBC 11.9 (*)    RBC 3.11 (*)     Hemoglobin 8.1 (*)    HCT 26.6 (*)    Platelets 569 (*)    Neutro Abs 9.8 (*)    All other components within normal limits  URINALYSIS, ROUTINE W REFLEX MICROSCOPIC - Abnormal; Notable for the following components:   APPearance HAZY (*)    Hgb urine dipstick MODERATE (*)    Protein, ur 30 (*)    Bacteria, UA RARE (*)    All other components within normal limits  CULTURE, BLOOD (ROUTINE X 2)  CULTURE, BLOOD (ROUTINE X 2)  LACTIC ACID, PLASMA  COMPREHENSIVE METABOLIC PANEL  LACTIC ACID, PLASMA    EKG None  Radiology No results found.  Procedures Procedures (including critical care time)  Medications Ordered in ED Medications  sodium chloride 0.9 % bolus 1,000 mL (has no administration in time range)  piperacillin-tazobactam (ZOSYN) IVPB 3.375 g (has no administration in time range)     Initial Impression / Assessment and Plan / ED Course  I have reviewed the triage vital signs and the nursing notes.  Pertinent labs & imaging results that were available during my care of the patient were reviewed by me and considered in my medical decision making (see chart for details).  Clinical Course as of Oct 27 1542  Sat Oct 27, 2018  1530 Pt not meeting sepsis criteria at this time with WBC count.   CBC with Differential(!) [AM]  1543 Will have pt follow up as an outpatient.   Hgb urine dipstickMarland Kitchen): MODERATE [AM]  1543 Hemoglobin(!): 8.1 [AM]    Clinical Course User Index [AM] Elisha Ponder, PA-C       Patient is nontoxic-appearing, afebrile, but tachycardic.  Patient presents with MRI result from [facility] imaging showing a large pelvic abscess secondary to her right inguinal hernia repair.  Zosyn and fluids started.  Blood cultures ordered.  Patient has a leukocytosis of 11.9.  Without fever, tachypnea, or hypotension, she is not meeting criteria for sepsis at this time, however be given fluids and antibiotics.  Patient has moderate hemoglobin in her urine.  Will have  patient follow-up as an outpatient for this.  She was informed of this result.  Case discussed with Dr. Derrell Lolling of general surgery who will review imaging and admit patient.  Appreciate his involvement of the care of this patient.  This is a shared visit with Dr. Adriana Simas. Patient was independently evaluated by this attending physician. Attending physician consulted in evaluation and admission management.  Final Clinical Impressions(s) / ED Diagnoses   Final diagnoses:  Pelvic abscess in female  Hematuria, unspecified type    ED Discharge Orders  None       Delia ChimesMurray, Alyssa B, PA-C 10/27/18 1544    Donnetta Hutchingook, Brian, MD 10/28/18 1242

## 2018-10-27 NOTE — Progress Notes (Signed)
Pt new admit from ED for s/p hernia repair alert and oriented, room air, complain of abdominal pain, left AC peripheral IV line, husband at the bedside, regular diet.

## 2018-10-27 NOTE — Progress Notes (Signed)
Pharmacy Antibiotic Note  [name] is a 53 y.o. female admitted on 10/27/2018 s/p inguinal hematoma repair with hematoma and pelvic abscess to start antibiotics.  Pharmacy has been consulted for Zosyn dosing. Afebrile. WBC slightly elevated at 11.9. LA 1.9.   Plan: Zosyn 3.375g IV x1 F/up CMET for further dosing    Temp (24hrs), Avg:97.4 F (36.3 C), Min:97.4 F (36.3 C), Max:97.4 F (36.3 C)  No results for input(s): WBC, CREATININE, LATICACIDVEN, VANCOTROUGH, VANCOPEAK, VANCORANDOM, GENTTROUGH, GENTPEAK, GENTRANDOM, TOBRATROUGH, TOBRAPEAK, TOBRARND, AMIKACINPEAK, AMIKACINTROU, AMIKACIN in the last 168 hours.  CrCl cannot be calculated (No successful lab value found.).    Not on File  Antimicrobials this admission: Zosyn 3/14 >>  Dose adjustments this admission:   Microbiology results: 3/14  BCx:   Thank you for allowing pharmacy to be a part of this patient's care.  Link Snuffer, PharmD, BCPS, BCCCP Clinical Pharmacist Please refer to [facility] for [facility] Pharmacy numbers 10/27/2018 3:00 PM

## 2018-10-27 NOTE — H&P (Signed)
[name] is an 53 y.o. female.   Chief Complaint: Right-sided abdominal pain HPI: Patient is a 53 year old female who previously underwent laparoscopic right inguinal hernia repair by Dr. gross on 10/04/2018. Patient states that since that time she said right-sided abdominal pain.  Patient was previously admitted for the above  On 10/12/2018.  Patient states that she is continue with right-sided abdominal pain.  Patient was recently sent for MRI of her abdomen and pelvis which revealed a large 17 x 12 x 12 cm intra-abdominal abscess.  Patient was at that time recommended to proceed to the ER for further evaluation and management.  And discussed with the patient she feels that she has had continued weakness of her right lower extremity.  She continues to have right-sided, right lower quadrant abdominal pain with sensation of bloating.  Patient states that at home she has had elevated temperature of 101+.  No past medical history on file.   No family history on file. Social History:  has no history on file for tobacco, alcohol, and drug.  Allergies: No Known Allergies  (Not in a hospital admission)   Results for orders placed or performed during the hospital encounter of 10/27/18 (from the past 48 hour(s))  CBC with Differential     Status: Abnormal   Collection Time: 10/27/18  2:28 PM  Result Value Ref Range   WBC 11.9 (H) 4.0 - 10.5 K/uL   RBC 3.11 (L) 3.87 - 5.11 MIL/uL   Hemoglobin 8.1 (L) 12.0 - 15.0 g/dL   HCT 08.6 (L) 57.8 - 46.9 %   MCV 85.5 80.0 - 100.0 fL   MCH 26.0 26.0 - 34.0 pg   MCHC 30.5 30.0 - 36.0 g/dL   RDW 62.9 52.8 - 41.3 %   Platelets 569 (H) 150 - 400 K/uL   nRBC 0.0 0.0 - 0.2 %   Neutrophils Relative % 82 %   Neutro Abs 9.8 (H) 1.7 - 7.7 K/uL   Lymphocytes Relative 8 %   Lymphs Abs 1.0 0.7 - 4.0 K/uL   Monocytes Relative 8 %   Monocytes Absolute 1.0 0.1 - 1.0 K/uL   Eosinophils Relative 1 %   Eosinophils Absolute 0.1 0.0 - 0.5 K/uL   Basophils  Relative 0 %   Basophils Absolute 0.0 0.0 - 0.1 K/uL   WBC Morphology See Note     Comment: Toxic Granulation  Vaculated Neutrophils Dohle Bodies    Immature Granulocytes 1 %   Abs Immature Granulocytes 0.06 0.00 - 0.07 K/uL    Comment: Performed at [facility], [address] [place], [place] [address]  Comprehensive metabolic panel     Status: Abnormal   Collection Time: 10/27/18  2:28 PM  Result Value Ref Range   Sodium 133 (L) 135 - 145 mmol/L   Potassium 3.5 3.5 - 5.1 mmol/L   Chloride 96 (L) 98 - 111 mmol/L   CO2 25 22 - 32 mmol/L   Glucose, Bld 142 (H) 70 - 99 mg/dL   BUN 16 6 - 20 mg/dL   Creatinine, Ser 0.27 (H) 0.44 - 1.00 mg/dL   Calcium 9.3 8.9 - 25.3 mg/dL   Total Protein 7.4 6.5 - 8.1 g/dL   Albumin 2.6 (L) 3.5 - 5.0 g/dL   AST 25 15 - 41 U/L   ALT 37 0 - 44 U/L   Alkaline Phosphatase 154 (H) 38 - 126 U/L   Total Bilirubin 0.9 0.3 - 1.2 mg/dL   GFR calc  non Af Amer 55 (L) >60 mL/min   GFR calc Af Amer >60 >60 mL/min   Anion gap 12 5 - 15    Comment: Performed at [facility], [address] [place], [place] [address]  Lactic acid, plasma     Status: None   Collection Time: 10/27/18  2:28 PM  Result Value Ref Range   Lactic Acid, Venous 1.9 0.5 - 1.9 mmol/L    Comment: Performed at [facility], [address] [place], [place] [address]  Urinalysis, Routine w reflex microscopic     Status: Abnormal   Collection Time: 10/27/18  2:44 PM  Result Value Ref Range   Color, Urine YELLOW YELLOW   APPearance HAZY (A) CLEAR   Specific Gravity, Urine 1.021 1.005 - 1.030   pH 5.0 5.0 - 8.0   Glucose, UA NEGATIVE NEGATIVE mg/dL   Hgb urine dipstick MODERATE (A) NEGATIVE   Bilirubin Urine NEGATIVE NEGATIVE   Ketones, ur NEGATIVE NEGATIVE mg/dL   Protein, ur 30 (A) NEGATIVE mg/dL   Nitrite NEGATIVE NEGATIVE   Leukocytes,Ua NEGATIVE NEGATIVE   RBC / HPF 0-5 0 - 5 RBC/hpf   WBC, UA 11-20 0 - 5 WBC/hpf   Bacteria, UA RARE (A) NONE SEEN   Squamous  Epithelial / LPF 6-10 0 - 5   Hyaline Casts, UA PRESENT     Comment: Performed at [facility], [address] [place], [place] [address]   No results found.  ROS  Blood pressure 110/68, pulse (!) 110, temperature (!) 97.4 F (36.3 C), temperature source Oral, resp. rate 16, SpO2 100 %. Physical Exam   Assessment/Plan 53 year old female with a large intra-abdominal abscess status post laparoscopic right inguinal hernia repair with mesh.   1.  We will admit, keep n.p.o., IV antibiotics 2.  We will consult IR for intra-abdominal abscess drain placement. 3.  Pain control.  Axel Filler, MD 10/27/2018, 4:06 PM

## 2018-10-27 NOTE — Progress Notes (Signed)
Pharmacy Antibiotic Note  [name] is a 53 y.o. female admitted on 10/27/2018 s/p inguinal hematoma repair with hematoma and pelvic abscess to start antibiotics.  Pharmacy has been consulted for Zosyn dosing. Afebrile. WBC slightly elevated at 11.9. LA 1.9.  -SCr= 1.15, CrCl ~ 60  Plan: -Zosyn 3.375gm IV q8h -Will follow renal function, cultures and clinical progress     Temp (24hrs), Avg:97.4 F (36.3 C), Min:97.4 F (36.3 C), Max:97.4 F (36.3 C)  Recent Labs  Lab 10/27/18 1428  WBC 11.9*  CREATININE 1.15*  LATICACIDVEN 1.9    CrCl cannot be calculated (Unknown ideal weight.).    No Known Allergies  Antimicrobials this admission: Zosyn 3/14 >>  Dose adjustments this admission:   Microbiology results: 3/14  BCx:   Thank you for allowing pharmacy to be a part of this patient's care.  Harland German, PharmD Clinical Pharmacist **Pharmacist phone directory can now be found on [email or web address] ([email or web address]).  Listed under [facility].

## 2018-10-27 NOTE — ED Notes (Signed)
Pt referred from MRI today, pelvic abscess.  S/p inguinal repair.

## 2018-10-28 ENCOUNTER — Inpatient Hospital Stay (HOSPITAL_COMMUNITY): Payer: 59

## 2018-10-28 DIAGNOSIS — R319 Hematuria, unspecified: Secondary | ICD-10-CM | POA: Diagnosis present

## 2018-10-28 DIAGNOSIS — N739 Female pelvic inflammatory disease, unspecified: Secondary | ICD-10-CM | POA: Diagnosis present

## 2018-10-28 DIAGNOSIS — D62 Acute posthemorrhagic anemia: Secondary | ICD-10-CM | POA: Diagnosis present

## 2018-10-28 DIAGNOSIS — K651 Peritoneal abscess: Secondary | ICD-10-CM | POA: Diagnosis present

## 2018-10-28 DIAGNOSIS — I10 Essential (primary) hypertension: Secondary | ICD-10-CM | POA: Diagnosis present

## 2018-10-28 DIAGNOSIS — K6811 Postprocedural retroperitoneal abscess: Secondary | ICD-10-CM | POA: Diagnosis not present

## 2018-10-28 DIAGNOSIS — T8143XA Infection following a procedure, organ and space surgical site, initial encounter: Secondary | ICD-10-CM | POA: Diagnosis not present

## 2018-10-28 LAB — CBC
HEMATOCRIT: 22.4 % — AB (ref 36.0–46.0)
Hemoglobin: 7.1 g/dL — ABNORMAL LOW (ref 12.0–15.0)
MCH: 26.9 pg (ref 26.0–34.0)
MCHC: 31.7 g/dL (ref 30.0–36.0)
MCV: 84.8 fL (ref 80.0–100.0)
Platelets: 501 10*3/uL — ABNORMAL HIGH (ref 150–400)
RBC: 2.64 MIL/uL — ABNORMAL LOW (ref 3.87–5.11)
RDW: 15.6 % — ABNORMAL HIGH (ref 11.5–15.5)
WBC: 12 10*3/uL — ABNORMAL HIGH (ref 4.0–10.5)
nRBC: 0 % (ref 0.0–0.2)

## 2018-10-28 LAB — PREPARE RBC (CROSSMATCH)

## 2018-10-28 LAB — PROTIME-INR
INR: 1.4 — ABNORMAL HIGH (ref 0.8–1.2)
Prothrombin Time: 16.6 seconds — ABNORMAL HIGH (ref 11.4–15.2)

## 2018-10-28 MED ORDER — MIDAZOLAM HCL 2 MG/2ML IJ SOLN
INTRAMUSCULAR | Status: AC | PRN
  Administered 2018-10-28: 1 mg via INTRAVENOUS
  Administered 2018-10-28 (×2): 0.5 mg via INTRAVENOUS

## 2018-10-28 MED ORDER — LIDOCAINE HCL 1 % IJ SOLN
INTRAMUSCULAR | Status: AC
  Filled 2018-10-28: qty 20

## 2018-10-28 MED ORDER — SODIUM CHLORIDE 0.9% IV SOLUTION: Freq: Once | INTRAVENOUS | Status: DC

## 2018-10-28 MED ORDER — SODIUM CHLORIDE 0.9 % IV BOLUS
1000.0000 mL | Freq: Once | INTRAVENOUS | Status: AC
  Administered 2018-10-28: 1000 mL via INTRAVENOUS

## 2018-10-28 MED ORDER — FENTANYL CITRATE (PF) 100 MCG/2ML IJ SOLN
INTRAMUSCULAR | Status: AC | PRN
  Administered 2018-10-28: 25 ug via INTRAVENOUS
  Administered 2018-10-28: 50 ug via INTRAVENOUS
  Administered 2018-10-28: 25 ug via INTRAVENOUS

## 2018-10-28 MED ORDER — MIDAZOLAM HCL 2 MG/2ML IJ SOLN
INTRAMUSCULAR | Status: AC
  Filled 2018-10-28: qty 4

## 2018-10-28 MED ORDER — VANCOMYCIN HCL 10 G IV SOLR
1500.0000 mg | INTRAVENOUS | Status: DC
  Administered 2018-10-28 – 2018-10-29 (×2): 1500 mg via INTRAVENOUS
  Filled 2018-10-28 (×4): qty 1500

## 2018-10-28 MED ORDER — FENTANYL CITRATE (PF) 100 MCG/2ML IJ SOLN
INTRAMUSCULAR | Status: AC
  Filled 2018-10-28: qty 4

## 2018-10-28 MED ORDER — SODIUM CHLORIDE 0.9% FLUSH
5.0000 mL | Freq: Three times a day (TID) | INTRAVENOUS | Status: DC
  Administered 2018-10-28 – 2018-10-30 (×4): 5 mL

## 2018-10-28 NOTE — Progress Notes (Signed)
BP 78/44. Pain 10/10. Dr. Cliffton Asters notified. Rapid Response RN notified.Orders received.

## 2018-10-28 NOTE — Progress Notes (Signed)
Pt with temp 103.1, HR 118-130, pain 10/10. MEWS Red. Pt to IR now. Rapid Response RN notified. Dr. Cliffton Asters notified. Tylenol given per orders. Radiology department notified of situation.

## 2018-10-28 NOTE — Sedation Documentation (Signed)
Patient is resting comfortably. 

## 2018-10-28 NOTE — Consult Note (Signed)
Chief Complaint: Patient was seen in consultation today for intraabdominal abscess.  Referring Physician(s): Axel Filler  Supervising Physician: Simonne Come  Patient Status: [facility]  History of Present Illness: [name] is a 53 y.o. female with a past medical history of hypertension. She underwent a laparoscopic bilateral inguinal hernia repair in OR 10/04/2018 by Dr. Michaell Cowing. She has had progressively worsening RLQ pain since surgery. She underwent MRI abdomen/pevlis which revealed a large intraabdominal abscess and she was sent to [facility] ED 10/27/2018 for further management. Surgery was consulted who recommends IR consultation for possible drain placement.  IR requested by Dr. Derrell Lolling for possible image-guided percutaneous intraabdominal abscess drain placement. Patient awake and alert laying in bed. Accompanied by husband at bedside. Complains of abdominal pain. States she cannot rate her pain because it is "so severe". Denies fever, chills, chest pain, dyspnea, or headache.   Past Medical History:  Diagnosis Date  . Hypertension     Past Surgical History:  Procedure Laterality Date  . HERNIA REPAIR      Allergies: Patient has no known allergies.  Medications: Prior to Admission medications   Medication Sig Start Date End Date Taking? Authorizing Provider  amLODipine (NORVASC) 5 MG tablet Take 5 mg by mouth daily.   Yes [provider]  losartan (COZAAR) 100 MG tablet Take 100 mg by mouth daily.   Yes [provider]  naproxen (NAPROSYN) 500 MG tablet Take 500 mg by mouth 2 (two) times daily as needed for mild pain.   Yes [provider]  oxyCODONE (OXY IR/ROXICODONE) 5 MG immediate release tablet Take 5-10 mg by mouth every 6 (six) hours as needed for severe pain.   Yes [provider]     History reviewed. No pertinent family history.  Social History   Socioeconomic History  . Marital status: Married    Spouse  name: Not on file  . Number of children: Not on file  . Years of education: Not on file  . Highest education level: Not on file  Occupational History  . Not on file  Social Needs  . Financial resource strain: Not on file  . Food insecurity:    Worry: Not on file    Inability: Not on file  . Transportation needs:    Medical: Not on file    Non-medical: Not on file  Tobacco Use  . Smoking status: Never Smoker  . Smokeless tobacco: Never Used  Substance and Sexual Activity  . Alcohol use: Never    Frequency: Never  . Drug use: Never  . Sexual activity: Not on file  Lifestyle  . Physical activity:    Days per week: Not on file    Minutes per session: Not on file  . Stress: Not on file  Relationships  . Social connections:    Talks on phone: Not on file    Gets together: Not on file    Attends religious service: Not on file    Active member of club or organization: Not on file    Attends meetings of clubs or organizations: Not on file    Relationship status: Not on file  Other Topics Concern  . Not on file  Social History Narrative  . Not on file     Review of Systems: A 12 point ROS discussed and pertinent positives are indicated in the HPI above.  All other systems are negative.  Review of Systems  Constitutional: Negative for chills and fever.  Respiratory: Negative for shortness of breath and wheezing.   Cardiovascular: Negative for chest pain and palpitations.  Gastrointestinal: Positive for abdominal pain.  Neurological: Negative for headaches.  Psychiatric/Behavioral: Negative for behavioral problems and confusion.    Vital Signs: BP 111/70 (BP Location: Right Arm)   Pulse (!) 115   Temp 99.9 F (37.7 C) (Oral)   Resp 20   Ht 5\' 6"  (1.676 m)   Wt 158 lb 4.6 oz (71.8 kg)   SpO2 100%   BMI 25.55 kg/m   Physical Exam Vitals signs and nursing note reviewed.  Constitutional:      General: She is not in acute distress.    Appearance: Normal appearance.   Cardiovascular:     Rate and Rhythm: Normal rate and regular rhythm.     Heart sounds: Normal heart sounds. No murmur.  Pulmonary:     Effort: Pulmonary effort is normal. No respiratory distress.     Breath sounds: Normal breath sounds. No wheezing.  Abdominal:     Tenderness: There is abdominal tenderness.  Skin:    General: Skin is warm and dry.  Neurological:     Mental Status: She is alert and oriented to person, place, and time.  Psychiatric:        Mood and Affect: Mood normal.        Behavior: Behavior normal.        Thought Content: Thought content normal.        Judgment: Judgment normal.      MD Evaluation Airway: WNL Heart: WNL Abdomen: WNL Chest/ Lungs: WNL ASA  Classification: 2 Mallampati/Airway Score: Two   Imaging: No results found.  Labs:  CBC: Recent Labs    10/27/18 1428 10/28/18 0344  WBC 11.9* 12.0*  HGB 8.1* 7.1*  HCT 26.6* 22.4*  PLT 569* 501*    COAGS: Recent Labs    10/28/18 0938  INR 1.4*    BMP: Recent Labs    10/27/18 1428  NA 133*  K 3.5  CL 96*  CO2 25  GLUCOSE 142*  BUN 16  CALCIUM 9.3  CREATININE 1.15*  GFRNONAA 55*  GFRAA >60    LIVER FUNCTION TESTS: Recent Labs    10/27/18 1428  BILITOT 0.9  AST 25  ALT 37  ALKPHOS 154*  PROT 7.4  ALBUMIN 2.6*     Assessment and Plan:  Intraabdominal abscess. Plan for image-guided percutaneous intraabdominal abscess drain placement tentatively for today (pending scheduling) with Dr. Grace Isaac. Patient is NPO. Currently afebrile. She does not take blood thinners. INR 1.4 seconds today.  Risks and benefits discussed with the patient including bleeding, infection, damage to adjacent structures, bowel perforation/fistula connection, and sepsis. All of the patient's questions were answered, patient is agreeable to proceed. Consent signed and in chart.   Thank you for this interesting consult.  I greatly enjoyed meeting [name] and look forward to  participating in their care.  A copy of this report was sent to the requesting provider on this date.  Electronically Signed: Elwin Mocha, PA-C 10/28/2018, 10:42 AM   I spent a total of 40 Minutes in face to face in clinical consultation, greater than 50% of which was counseling/coordinating care for intraabdominal abscess.

## 2018-10-28 NOTE — Significant Event (Addendum)
Rapid Response Event Note  Overview: Called d/t hypotension and abd pain 10/10 post abd drain placement Time Called: 1853 Arrival Time: 1905 Event Type: Hypotension  Initial Focused Assessment: Pt lying in bed, resting, alert and oriented, skin warm and dry, T-98.6, HR-88, BP-80/38 (78/44 manually), RR-12, SpO2-100% on RA.  Pt says pain is gone now but was a sharp/jabbing pain near drain insertion site.  Abd soft.  1L NS bolus infusing.  ? Vagal response from pain?  Follow-up BP 110/68.  Interventions: 1L NS bolus. Dr. Cliffton Asters to beside to assess patient. 2 units PRBCs and post H/H ordered.  Plan of Care (if not transferred): Finish 1L NS. 2 units PRBCs to be transfused (hgb 7.1). Post-transfusion H/H to be ordered once PRBCs done. Will monitor pt and move to ICU if needed. Please call RRT if further assistance needed.  Event Summary: Name of Physician Notified: Dr. Cliffton Asters at 1859    at    Outcome: Stayed in room and stabalized     [name], [name]

## 2018-10-28 NOTE — Progress Notes (Signed)
[facility] Progress Note     Subjective: CC: pain Patient complaining of pain in RLQ, feels sharp and inhibits ability to mobilize. Was taking tylenol and oxycodone at home and feels this may have been masking any fevers. Denies nausea or vomiting. Having bowel function with prune juice. Patient upset that she did not have any imaging study earlier.   Objective: Vital signs in last 24 hours: Temp:  [97.4 F (36.3 C)-101.9 F (38.8 C)] 99.9 F (37.7 C) (03/15 0653) Pulse Rate:  [105-123] 115 (03/15 0533) Resp:  [16-20] 20 (03/15 0533) BP: (93-122)/(67-76) 111/70 (03/15 0533) SpO2:  [92 %-100 %] 100 % (03/15 0533) Weight:  [71.8 kg] 71.8 kg (03/14 1729) Last BM Date: 10/27/18  Intake/Output from previous day: 03/14 0701 - 03/15 0700 In: 782.7 [I.V.:484.1; IV Piggyback:298.6] Out: -  Intake/Output this shift: No intake/output data recorded.  PE: Gen:  Alert, NAD, pleasant Card:  Sinus tachycardia  Pulm:  Normal effort, clear to auscultation bilaterally Abd: Soft, TTP in RLQ, mildly distended, +BS, incisions well healed, no guarding or rebound tenderness Skin: warm and dry, no rashes  Psych: A&Ox3   Lab Results:  Recent Labs    10/27/18 1428 10/28/18 0344  WBC 11.9* 12.0*  HGB 8.1* 7.1*  HCT 26.6* 22.4*  PLT 569* 501*   BMET Recent Labs    10/27/18 1428  NA 133*  K 3.5  CL 96*  CO2 25  GLUCOSE 142*  BUN 16  CREATININE 1.15*  CALCIUM 9.3   PT/INR No results for input(s): LABPROT, INR in the last 72 hours. CMP     Component Value Date/Time   NA 133 (L) 10/27/2018 1428   K 3.5 10/27/2018 1428   CL 96 (L) 10/27/2018 1428   CO2 25 10/27/2018 1428   GLUCOSE 142 (H) 10/27/2018 1428   BUN 16 10/27/2018 1428   CREATININE 1.15 (H) 10/27/2018 1428   CALCIUM 9.3 10/27/2018 1428   PROT 7.4 10/27/2018 1428   ALBUMIN 2.6 (L) 10/27/2018 1428   AST 25 10/27/2018 1428   ALT 37 10/27/2018 1428   ALKPHOS 154 (H) 10/27/2018 1428   BILITOT 0.9  10/27/2018 1428   GFRNONAA 55 (L) 10/27/2018 1428   GFRAA >60 10/27/2018 1428   Lipase  No results found for: LIPASE     Studies/Results: No results found.  Anti-infectives: Anti-infectives (From admission, onward)   Start     Dose/Rate Route Frequency Ordered Stop   10/27/18 2200  piperacillin-tazobactam (ZOSYN) IVPB 3.375 g     3.375 g 12.5 mL/hr over 240 Minutes Intravenous Every 8 hours 10/27/18 1712     10/27/18 1515  piperacillin-tazobactam (ZOSYN) IVPB 3.375 g  Status:  Discontinued     3.375 g 12.5 mL/hr over 240 Minutes Intravenous Every 8 hours 10/27/18 1503 10/27/18 1504   10/27/18 1515  piperacillin-tazobactam (ZOSYN) IVPB 3.375 g     3.375 g 100 mL/hr over 30 Minutes Intravenous  Once 10/27/18 1504 10/27/18 1613       Assessment/Plan large intra-abdominal abscess status post laparoscopic right inguinal hernia repair with mesh - IR consult pending for drainage of abscess - WBC 12.0, Tmax 101.2, tachycardic - continue IV zosyn - ok to have diet after IR procedure  FEN: NPO, IVF VTE: SCDs ID: Zosyn 3/14>>  LOS: 0 days    Wells Guiles , [facility] 10/28/2018, 8:41 AM Pager: [phone] Consults: [phone]

## 2018-10-28 NOTE — Sedation Documentation (Signed)
Patient is resting

## 2018-10-28 NOTE — Progress Notes (Signed)
Pharmacy Antibiotic Note  [name] is a 53 y.o. female admitted on 10/27/2018 s/p inguinal hematoma repair with hematoma and pelvic abscess to start antibiotics.  Pharmacy has been consulted for Zosyn dosing. Afebrile. WBC slightly elevated at 11.9. LA 1.9.  -SCr= 1.15, CrCl ~ 60  Added Vancomycin 3/15  Plan: -Zosyn 3.375gm IV q8h -Will follow renal function, cultures and clinical progress -Add Vancomycin 1500 mg iv Q 24 hours  Height: 5\' 6"  (167.6 cm) Weight: 158 lb 4.6 oz (71.8 kg) IBW/kg (Calculated) : 59.3  Temp (24hrs), Avg:100.9 F (38.3 C), Min:98.5 F (36.9 C), Max:103.1 F (39.5 C)  Recent Labs  Lab 10/27/18 1428 10/27/18 1829 10/28/18 0344  WBC 11.9*  --  12.0*  CREATININE 1.15*  --   --   LATICACIDVEN 1.9 1.2  --     Estimated Creatinine Clearance: 58.1 mL/min (A) (by C-G formula based on SCr of 1.15 mg/dL (H)).    No Known Allergies  Antimicrobials this admission: Zosyn 3/14 >> Vancomycin 3/15>>  Dose adjustments this admission:   Microbiology results: 3/14  BCx:   Thank you for allowing pharmacy to be a part of this patient's care. Okey Regal, PharmD  **Pharmacist phone directory can now be found on [email or web address] ([email or web address]).  Listed under [facility].

## 2018-10-28 NOTE — Procedures (Signed)
Pre procedural Dx: Post op abdominal abscess Post procedural Dx: Same  Technically successful CT guided placed of a 10 Fr drainage catheter placement into the right lower abdomen yielding 1.1 L of purulent fluid.    A sample of aspirated fluid was sent to the laboratory for analysis.    EBL: None Complications: None immediate  Katherina Right, MD Pager #: [phone]

## 2018-10-28 NOTE — Sedation Documentation (Signed)
Vital signs stable. 

## 2018-10-28 NOTE — Progress Notes (Signed)
Notified by RN of low blood pressure and sharp pain at drain site. Came to bedside to see her. She reports feeling "100% better" since having drain placed. She had an episode of sharp shooting pain specifically at her drain site that was transient and related to torquing her abdomen. Gone now. Denies any abdominal pain at this time. Reports return of leg strength and sensation since drainage procedure  AF; VSS - blood pressure was noted to be 78/44 15 minutes prior but currently is 110/70. HR 80s. Sats normal on room air.  NAD, comfortable and conversant RRR Abdomen is soft, NT/ND; drain in place with pink purulent output Ext warm, well perfused  Hgb noted 7 this morning from 8 yesterday  ASSESSMENT/PLAN Potentially had vasovagal event? HR has remained normal and she has no abdominal pain  -1L IVF bolus -2U PRBC transfusion given anemia and now intermittent episodes of low blood pressure -H&H following 2nd unit

## 2018-10-28 NOTE — Significant Event (Signed)
Rapid Response Event Note  Overview: Time Called: 1450 Event Type: MEWS  Initial Focused Assessment/Interventions: Rn called because MEWS 6 patient temp 103.1  HR 130, RR 22  BP 135/70 O2 sats 97% on RA.  Unable to physically see patient because she went to radiology for drain placement.  1615  Pt returned to 6N post drain placement.  Per notes 1.1L purulent fluid. Temp 100.5  BP 107/63  HR 107 RR 12  O2 sat 100% on RA  Patient feels better is able to take some clear liquids.   IVF at 100cc/hr 1744  Temp 98.8  BP 103/66  HR 101  RR 12  O2 sat 99% on RA.    Plan of Care (if not transferred): RN to call if further assistance needed.  Event Summary: Name of Physician Notified: Dr Cliffton Asters at      at    Outcome: Stayed in room and stabalized     [name], [place]

## 2018-10-28 NOTE — Sedation Documentation (Signed)
Patient is resting comfortably. Procedure started °

## 2018-10-29 ENCOUNTER — Other Ambulatory Visit: Payer: Self-pay | Admitting: Surgery

## 2018-10-29 DIAGNOSIS — N9489 Other specified conditions associated with female genital organs and menstrual cycle: Secondary | ICD-10-CM

## 2018-10-29 LAB — BASIC METABOLIC PANEL
Anion gap: 9 (ref 5–15)
BUN: <5 mg/dL — ABNORMAL LOW (ref 6–20)
CO2: 27 mmol/L (ref 22–32)
Calcium: 8.6 mg/dL — ABNORMAL LOW (ref 8.9–10.3)
Chloride: 102 mmol/L (ref 98–111)
Creatinine, Ser: 0.68 mg/dL (ref 0.44–1.00)
GFR calc Af Amer: >60 mL/min (ref >60)
GFR calc non Af Amer: >60 mL/min (ref >60)
GLUCOSE: 104 mg/dL — AB (ref 70–99)
Potassium: 3.3 mmol/L — ABNORMAL LOW (ref 3.5–5.1)
Sodium: 138 mmol/L (ref 135–145)

## 2018-10-29 LAB — CBC
HEMATOCRIT: 30.5 % — AB (ref 36.0–46.0)
Hemoglobin: 10.2 g/dL — ABNORMAL LOW (ref 12.0–15.0)
MCH: 27.8 pg (ref 26.0–34.0)
MCHC: 33.4 g/dL (ref 30.0–36.0)
MCV: 83.1 fL (ref 80.0–100.0)
Platelets: 523 10*3/uL — ABNORMAL HIGH (ref 150–400)
RBC: 3.67 MIL/uL — ABNORMAL LOW (ref 3.87–5.11)
RDW: 15.2 % (ref 11.5–15.5)
WBC: 9.8 10*3/uL (ref 4.0–10.5)
nRBC: 0 % (ref 0.0–0.2)

## 2018-10-29 LAB — ABO/RH: ABO/RH(D): O NEG

## 2018-10-29 NOTE — Progress Notes (Signed)
Pt received 1st unit of prbc, temp after transfusion is 100.4, informed dr. Cliffton Asters and stated that its ok to proceed with the 2nd bag of prbc.

## 2018-10-29 NOTE — Progress Notes (Signed)
[facility] Progress Note     Subjective: CC: pain Patient still having some pain in R abdomen but reports it is definitely improved from yesterday. Patient denies nausea or vomiting. Got 2 units PRBC overnight for anemia. RLE weakness improving.   Objective: Vital signs in last 24 hours: Temp:  [98.6 F (37 C)-103.1 F (39.5 C)] 100.2 F (37.9 C) (03/16 0630) Pulse Rate:  [88-130] 98 (03/16 0630) Resp:  [10-22] 16 (03/16 0630) BP: (78-135)/(38-76) 119/75 (03/16 0630) SpO2:  [97 %-100 %] 100 % (03/16 0630) Last BM Date: 10/27/18  Intake/Output from previous day: 03/15 0701 - 03/16 0700 In: 1991 [I.V.:645; IV Piggyback:236] Out: 275 [Drains:275] Intake/Output this shift: No intake/output data recorded.  PE: Gen:  Alert, NAD, pleasant Card:  Sinus tachycardia  Pulm:  Normal effort, clear to auscultation bilaterally Abd: Soft, TTP in RLQ, mildly distended, +BS, incisions well healed, no guarding or rebound tenderness, drain in RLQ with bloody purulent drainage Skin: warm and dry, no rashes  Psych: A&Ox3   Lab Results:  Recent Labs    10/27/18 1428 10/28/18 0344  WBC 11.9* 12.0*  HGB 8.1* 7.1*  HCT 26.6* 22.4*  PLT 569* 501*   BMET Recent Labs    10/27/18 1428  NA 133*  K 3.5  CL 96*  CO2 25  GLUCOSE 142*  BUN 16  CREATININE 1.15*  CALCIUM 9.3   PT/INR Recent Labs    10/28/18 0938  LABPROT 16.6*  INR 1.4*   CMP     Component Value Date/Time   NA 133 (L) 10/27/2018 1428   K 3.5 10/27/2018 1428   CL 96 (L) 10/27/2018 1428   CO2 25 10/27/2018 1428   GLUCOSE 142 (H) 10/27/2018 1428   BUN 16 10/27/2018 1428   CREATININE 1.15 (H) 10/27/2018 1428   CALCIUM 9.3 10/27/2018 1428   PROT 7.4 10/27/2018 1428   ALBUMIN 2.6 (L) 10/27/2018 1428   AST 25 10/27/2018 1428   ALT 37 10/27/2018 1428   ALKPHOS 154 (H) 10/27/2018 1428   BILITOT 0.9 10/27/2018 1428   GFRNONAA 55 (L) 10/27/2018 1428   GFRAA >60 10/27/2018 1428   Lipase  No results  found for: LIPASE     Studies/Results: Ct Image Guided Drainage By Percutaneous Catheter  Result Date: 10/28/2018 INDICATION: History of inguinal hernia repair on 10/04/2018 now with large intra-abdominal abscess. Please perform CT-guided percutaneous drainage catheter placement for infection source control purposes. EXAM: CT IMAGE GUIDED DRAINAGE BY PERCUTANEOUS CATHETER COMPARISON:  Pelvic MRI -  10/27/2018 MEDICATIONS: The patient is currently admitted to the hospital and receiving intravenous antibiotics. The antibiotics were administered within an appropriate time frame prior to the initiation of the procedure. ANESTHESIA/SEDATION: Moderate (conscious) sedation was employed during this procedure. A total of Versed 2 mg and Fentanyl 100 mcg was administered intravenously. Moderate Sedation Time: 20 minutes. The patient's level of consciousness and vital signs were monitored continuously by radiology nursing throughout the procedure under my direct supervision. CONTRAST:  None COMPLICATIONS: None immediate. PROCEDURE: Informed written consent was obtained from the patient after a discussion of the risks, benefits and alternatives to treatment. The patient was placed supine on the CT gantry and a pre procedural CT was performed re-demonstrating the known abscess/fluid collection within the right mid and lower abdomen with dominant serpiginous component measuring at least 16.7 x 10.6 cm (57, series 2). The procedure was planned. A timeout was performed prior to the initiation of the procedure. The skin overlying the right inferolateral  abdomen was prepped and draped in the usual sterile fashion. The overlying soft tissues were anesthetized with 1% lidocaine with epinephrine. Appropriate trajectory was planned with the use of a 22 gauge spinal needle. An 18 gauge trocar needle was advanced into the abscess/fluid collection and a short Amplatz super stiff wire was coiled within the collection. Appropriate  positioning was confirmed with a limited CT scan. The tract was serially dilated allowing placement of a 10 [place] all-purpose drainage catheter. Appropriate positioning was confirmed with a limited postprocedural CT scan. Approximately 1.1 L of purulent, foul smelling fluid was aspirated. The tube was connected to a drainage bag and sutured in place. A dressing was placed. The patient tolerated the procedure well without immediate post procedural complication. IMPRESSION: Successful CT guided placement of a 10 French all purpose drain catheter into the large abscess located within the right mid/lower abdomen with aspiration of approximately 1.1 L of purulent fluid. Samples were sent to the laboratory as requested by the ordering clinical team. Electronically Signed   By: Simonne Come M.D.   On: 10/28/2018 16:33    Anti-infectives: Anti-infectives (From admission, onward)   Start     Dose/Rate Route Frequency Ordered Stop   10/28/18 1800  vancomycin (VANCOCIN) 1,500 mg in sodium chloride 0.9 % 500 mL IVPB     1,500 mg 250 mL/hr over 120 Minutes Intravenous Every 24 hours 10/28/18 1633     10/27/18 2200  piperacillin-tazobactam (ZOSYN) IVPB 3.375 g     3.375 g 12.5 mL/hr over 240 Minutes Intravenous Every 8 hours 10/27/18 1712     10/27/18 1515  piperacillin-tazobactam (ZOSYN) IVPB 3.375 g  Status:  Discontinued     3.375 g 12.5 mL/hr over 240 Minutes Intravenous Every 8 hours 10/27/18 1503 10/27/18 1504   10/27/18 1515  piperacillin-tazobactam (ZOSYN) IVPB 3.375 g     3.375 g 100 mL/hr over 30 Minutes Intravenous  Once 10/27/18 1504 10/27/18 1613       Assessment/Plan large intra-abdominal abscess status post laparoscopic right inguinal hernia repair with mesh - WBC 12.0 yesterday, Tmax 100.4, tachycardic - continue IV zosyn and vanc - IR able to drain 1.1 L of purulent material out at time of procedure and patient has had another 275 cc out since  ABL anemia - s/p 2 units PRBC, repeat CBC  today   FEN: regular diet  VTE: SCDs ID: Zosyn 3/14>>, vancomycin 3/15>>  LOS: 1 day    Wells Guiles , [facility] 10/29/2018, 8:08 AM Pager: [phone] Consults: [phone]

## 2018-10-29 NOTE — Progress Notes (Signed)
Referring Physician(s): Dr Grayling Congress  Supervising Physician: Irish Lack  Patient Status:  [facility] - In-pt  Chief Complaint:  Rt Inguinal hernia repair RLQ abscess' Drain placed 3/15  Subjective:  Op great; bloody; brown Pt feeling so much better today OP 275 cc yesterday 150 so far today abd soft; minimally tender    Allergies: Patient has no known allergies.  Medications: Prior to Admission medications   Medication Sig Start Date End Date Taking? Authorizing Provider  amLODipine (NORVASC) 5 MG tablet Take 5 mg by mouth daily.   Yes [provider]  losartan (COZAAR) 100 MG tablet Take 100 mg by mouth daily.   Yes [provider]  naproxen (NAPROSYN) 500 MG tablet Take 500 mg by mouth 2 (two) times daily as needed for mild pain.   Yes [provider]  oxyCODONE (OXY IR/ROXICODONE) 5 MG immediate release tablet Take 5-10 mg by mouth every 6 (six) hours as needed for severe pain.   Yes [provider]     Vital Signs: BP 119/75   Pulse 98   Temp 100.2 F (37.9 C) (Oral)   Resp 16   Ht 5\' 6"  (1.676 m)   Wt 158 lb 4.6 oz (71.8 kg)   SpO2 100%   BMI 25.55 kg/m   Physical Exam Vitals signs reviewed.  Abdominal:     General: Bowel sounds are normal.     Palpations: Abdomen is soft.  Musculoskeletal: Normal range of motion.  Skin:    General: Skin is warm and dry.     Comments: Site is clean and dry Minimally tender OP bloody/brown 275 cc yesterday; 150 cc today afeb Wbc wnl     Neurological:     Mental Status: She is alert.     Imaging: Ct Image Guided Drainage By Percutaneous Catheter  Result Date: 10/28/2018 INDICATION: History of inguinal hernia repair on 10/04/2018 now with large intra-abdominal abscess. Please perform CT-guided percutaneous drainage catheter placement for infection source control purposes. EXAM: CT IMAGE GUIDED DRAINAGE BY PERCUTANEOUS CATHETER COMPARISON:  Pelvic MRI -  10/27/2018  MEDICATIONS: The patient is currently admitted to the hospital and receiving intravenous antibiotics. The antibiotics were administered within an appropriate time frame prior to the initiation of the procedure. ANESTHESIA/SEDATION: Moderate (conscious) sedation was employed during this procedure. A total of Versed 2 mg and Fentanyl 100 mcg was administered intravenously. Moderate Sedation Time: 20 minutes. The patient's level of consciousness and vital signs were monitored continuously by radiology nursing throughout the procedure under my direct supervision. CONTRAST:  None COMPLICATIONS: None immediate. PROCEDURE: Informed written consent was obtained from the patient after a discussion of the risks, benefits and alternatives to treatment. The patient was placed supine on the CT gantry and a pre procedural CT was performed re-demonstrating the known abscess/fluid collection within the right mid and lower abdomen with dominant serpiginous component measuring at least 16.7 x 10.6 cm (57, series 2). The procedure was planned. A timeout was performed prior to the initiation of the procedure. The skin overlying the right inferolateral abdomen was prepped and draped in the usual sterile fashion. The overlying soft tissues were anesthetized with 1% lidocaine with epinephrine. Appropriate trajectory was planned with the use of a 22 gauge spinal needle. An 18 gauge trocar needle was advanced into the abscess/fluid collection and a short Amplatz super stiff wire was coiled within the collection. Appropriate positioning was confirmed with a limited CT scan. The tract was serially dilated allowing placement of  a 10 [place] all-purpose drainage catheter. Appropriate positioning was confirmed with a limited postprocedural CT scan. Approximately 1.1 L of purulent, foul smelling fluid was aspirated. The tube was connected to a drainage bag and sutured in place. A dressing was placed. The patient tolerated the procedure well  without immediate post procedural complication. IMPRESSION: Successful CT guided placement of a 10 French all purpose drain catheter into the large abscess located within the right mid/lower abdomen with aspiration of approximately 1.1 L of purulent fluid. Samples were sent to the laboratory as requested by the ordering clinical team. Electronically Signed   By: Simonne Come M.D.   On: 10/28/2018 16:33    Labs:  CBC: Recent Labs    10/27/18 1428 10/28/18 0344 10/29/18 0853  WBC 11.9* 12.0* 9.8  HGB 8.1* 7.1* 10.2*  HCT 26.6* 22.4* 30.5*  PLT 569* 501* 523*    COAGS: Recent Labs    10/28/18 0938  INR 1.4*    BMP: Recent Labs    10/27/18 1428 10/29/18 0853  NA 133* 138  K 3.5 3.3*  CL 96* 102  CO2 25 27  GLUCOSE 142* 104*  BUN 16 <5*  CALCIUM 9.3 8.6*  CREATININE 1.15* 0.68  GFRNONAA 55* >60  GFRAA >60 >60    LIVER FUNCTION TESTS: Recent Labs    10/27/18 1428  BILITOT 0.9  AST 25  ALT 37  ALKPHOS 154*  PROT 7.4  ALBUMIN 2.6*    Assessment and Plan:  RLQ abscess drain intact Will need flushed daily 5-10 cc sterile saline Record OP IR scheduler will call pt for OP visit to IR Clinic OP orders in place  Electronically Signed: Robet Leu, PA-C 10/29/2018, 2:02 PM   I spent a total of 15 Minutes at the the patient's bedside AND on the patient's hospital floor or unit, greater than 50% of which was counseling/coordinating care for RLQ abscess drain

## 2018-10-30 LAB — TYPE AND SCREEN
ABO/RH(D): O NEG
Antibody Screen: NEGATIVE
Unit division: 0
Unit division: 0

## 2018-10-30 LAB — CBC
HCT: 32.6 % — ABNORMAL LOW (ref 36.0–46.0)
HEMOGLOBIN: 10.5 g/dL — AB (ref 12.0–15.0)
MCH: 27 pg (ref 26.0–34.0)
MCHC: 32.2 g/dL (ref 30.0–36.0)
MCV: 83.8 fL (ref 80.0–100.0)
Platelets: 493 10*3/uL — ABNORMAL HIGH (ref 150–400)
RBC: 3.89 MIL/uL (ref 3.87–5.11)
RDW: 15.2 % (ref 11.5–15.5)
WBC: 9.2 10*3/uL (ref 4.0–10.5)
nRBC: 0 % (ref 0.0–0.2)

## 2018-10-30 LAB — BPAM RBC
BLOOD PRODUCT EXPIRATION DATE: 202003212359
Blood Product Expiration Date: 202003212359
ISSUE DATE / TIME: 202003152338
ISSUE DATE / TIME: 202003160331
UNIT TYPE AND RH: 9500
Unit Type and Rh: 9500

## 2018-10-30 LAB — CREATININE, SERUM
Creatinine, Ser: 0.68 mg/dL (ref 0.44–1.00)
GFR calc Af Amer: >60 mL/min (ref >60)
GFR calc non Af Amer: >60 mL/min (ref >60)

## 2018-10-30 MED ORDER — ACETAMINOPHEN 325 MG PO TABS: 650.0000 mg | ORAL_TABLET | Freq: Four times a day (QID) | ORAL | Status: AC | PRN

## 2018-10-30 MED ORDER — AMOXICILLIN-POT CLAVULANATE 875-125 MG PO TABS: 1.0000 | ORAL_TABLET | Freq: Two times a day (BID) | ORAL | 0 refills | Status: AC

## 2018-10-30 MED ORDER — SODIUM CHLORIDE 0.9% FLUSH: 5.0000 mL | Freq: Three times a day (TID) | INTRAVENOUS | 1 refills | Status: AC

## 2018-10-30 MED ORDER — OXYCODONE HCL 5 MG PO TABS: 5.0000 mg | ORAL_TABLET | Freq: Four times a day (QID) | ORAL | 0 refills | Status: AC | PRN

## 2018-10-30 MED ORDER — DOXYCYCLINE HYCLATE 50 MG PO CAPS: 50.0000 mg | ORAL_CAPSULE | Freq: Two times a day (BID) | ORAL | 0 refills | Status: AC

## 2018-10-30 MED FILL — DOXYCYCLINE HYC 50 MG CAP: 50 | 10 days supply | Qty: 20 | Fill #0

## 2018-10-30 MED FILL — AMOX-CLAV 875-125 MG TABLET: 875-125 | 10 days supply | Qty: 20 | Fill #0

## 2018-10-30 MED FILL — oxyCODONE HCL 5 MG TABS: 5 | 8 days supply | Qty: 30 | Fill #0

## 2018-10-30 NOTE — Progress Notes (Signed)
[facility] Progress Note     Subjective: CC: pain  Patient right sided pain stable. Patient really hoping to get home today. RLE weakness improving. Tolerating diet and having bowel function. Feels comfortable with home drain care.   Objective: Vital signs in last 24 hours: Temp:  [98.6 F (37 C)-99.5 F (37.5 C)] 98.6 F (37 C) (03/17 1540) Pulse Rate:  [87-109] 87 (03/17 0632) Resp:  [18] 18 (03/17 0867) BP: (124-136)/(72-90) 132/90 (03/17 0632) SpO2:  [100 %] 100 % (03/17 6195) Last BM Date: 10/30/18  Intake/Output from previous day: 03/16 0701 - 03/17 0700 In: 2805.4 [P.O.:360; I.V.:2228.4; IV Piggyback:202] Out: 925 [Urine:675; Drains:250] Intake/Output this shift: No intake/output data recorded.  PE: Gen: Alert, NAD, pleasant Card:regular rate and rhythm  Pulm: Normal effort, clear to auscultation bilaterally Abd: Soft,TTP in RLQ,mildlydistended,+BS, incisions well healed, no guarding or rebound tenderness, drain in RLQ with bloody purulent drainage Skin: warm and dry, no rashes  Psych: A&Ox3   Lab Results:  Recent Labs    10/28/18 0344 10/29/18 0853  WBC 12.0* 9.8  HGB 7.1* 10.2*  HCT 22.4* 30.5*  PLT 501* 523*   BMET Recent Labs    10/27/18 1428 10/29/18 0853 10/30/18 0333  NA 133* 138  --   K 3.5 3.3*  --   CL 96* 102  --   CO2 25 27  --   GLUCOSE 142* 104*  --   BUN 16 <5*  --   CREATININE 1.15* 0.68 0.68  CALCIUM 9.3 8.6*  --    PT/INR Recent Labs    10/28/18 0938  LABPROT 16.6*  INR 1.4*   CMP     Component Value Date/Time   NA 138 10/29/2018 0853   K 3.3 (L) 10/29/2018 0853   CL 102 10/29/2018 0853   CO2 27 10/29/2018 0853   GLUCOSE 104 (H) 10/29/2018 0853   BUN <5 (L) 10/29/2018 0853   CREATININE 0.68 10/30/2018 0333   CALCIUM 8.6 (L) 10/29/2018 0853   PROT 7.4 10/27/2018 1428   ALBUMIN 2.6 (L) 10/27/2018 1428   AST 25 10/27/2018 1428   ALT 37 10/27/2018 1428   ALKPHOS 154 (H) 10/27/2018 1428    BILITOT 0.9 10/27/2018 1428   GFRNONAA >60 10/30/2018 0333   GFRAA >60 10/30/2018 0333   Lipase  No results found for: LIPASE     Studies/Results: Ct Image Guided Drainage By Percutaneous Catheter  Result Date: 10/28/2018 INDICATION: History of inguinal hernia repair on 10/04/2018 now with large intra-abdominal abscess. Please perform CT-guided percutaneous drainage catheter placement for infection source control purposes. EXAM: CT IMAGE GUIDED DRAINAGE BY PERCUTANEOUS CATHETER COMPARISON:  Pelvic MRI -  10/27/2018 MEDICATIONS: The patient is currently admitted to the hospital and receiving intravenous antibiotics. The antibiotics were administered within an appropriate time frame prior to the initiation of the procedure. ANESTHESIA/SEDATION: Moderate (conscious) sedation was employed during this procedure. A total of Versed 2 mg and Fentanyl 100 mcg was administered intravenously. Moderate Sedation Time: 20 minutes. The patient's level of consciousness and vital signs were monitored continuously by radiology nursing throughout the procedure under my direct supervision. CONTRAST:  None COMPLICATIONS: None immediate. PROCEDURE: Informed written consent was obtained from the patient after a discussion of the risks, benefits and alternatives to treatment. The patient was placed supine on the CT gantry and a pre procedural CT was performed re-demonstrating the known abscess/fluid collection within the right mid and lower abdomen with dominant serpiginous component measuring at least 16.7 x 10.6  cm (57, series 2). The procedure was planned. A timeout was performed prior to the initiation of the procedure. The skin overlying the right inferolateral abdomen was prepped and draped in the usual sterile fashion. The overlying soft tissues were anesthetized with 1% lidocaine with epinephrine. Appropriate trajectory was planned with the use of a 22 gauge spinal needle. An 18 gauge trocar needle was advanced into  the abscess/fluid collection and a short Amplatz super stiff wire was coiled within the collection. Appropriate positioning was confirmed with a limited CT scan. The tract was serially dilated allowing placement of a 10 [place] all-purpose drainage catheter. Appropriate positioning was confirmed with a limited postprocedural CT scan. Approximately 1.1 L of purulent, foul smelling fluid was aspirated. The tube was connected to a drainage bag and sutured in place. A dressing was placed. The patient tolerated the procedure well without immediate post procedural complication. IMPRESSION: Successful CT guided placement of a 10 French all purpose drain catheter into the large abscess located within the right mid/lower abdomen with aspiration of approximately 1.1 L of purulent fluid. Samples were sent to the laboratory as requested by the ordering clinical team. Electronically Signed   By: Simonne Come M.D.   On: 10/28/2018 16:33    Anti-infectives: Anti-infectives (From admission, onward)   Start     Dose/Rate Route Frequency Ordered Stop   10/28/18 1800  vancomycin (VANCOCIN) 1,500 mg in sodium chloride 0.9 % 500 mL IVPB     1,500 mg 250 mL/hr over 120 Minutes Intravenous Every 24 hours 10/28/18 1633     10/27/18 2200  piperacillin-tazobactam (ZOSYN) IVPB 3.375 g     3.375 g 12.5 mL/hr over 240 Minutes Intravenous Every 8 hours 10/27/18 1712     10/27/18 1515  piperacillin-tazobactam (ZOSYN) IVPB 3.375 g  Status:  Discontinued     3.375 g 12.5 mL/hr over 240 Minutes Intravenous Every 8 hours 10/27/18 1503 10/27/18 1504   10/27/18 1515  piperacillin-tazobactam (ZOSYN) IVPB 3.375 g     3.375 g 100 mL/hr over 30 Minutes Intravenous  Once 10/27/18 1504 10/27/18 1613       Assessment/Plan large intra-abdominal abscess status post laparoscopic right inguinal hernia repair with mesh - WBC 9.8 yesterday, afebrile - cultures still pending  - 250 cc purulent material from drain  ABL anemia - s/p 2 units  PRBC 3/16, hgb came up to 10.2  FEN: regular diet  VTE: SCDs ID: Zosyn 3/14>>, vancomycin 3/15>>  Repeat CBC - if stable could potentially discharge today. Discussed with pharmacy if patient does discharge today could send home on PO augmentin +/- PO doxycycline for MRSA coverage.   LOS: 2 days    Wells Guiles , [facility] 10/30/2018, 10:44 AM Pager: [phone] Consults: [phone]

## 2018-10-30 NOTE — Discharge Instructions (Signed)
Percutaneous Abscess Drain, Care After  This sheet gives you information about how to care for yourself after your procedure. Your health care provider may also give you more specific instructions. If you have problems or questions, contact your health care provider.  What can I expect after the procedure?  After your procedure, it is common to have:  · A small amount of bruising and discomfort in the area where the drainage tube (catheter) was placed.  · Sleepiness and fatigue. This should go away after the medicines you were given have worn off.  Follow these instructions at home:  Incision care  · Follow instructions from your health care provider about how to take care of your incision. Make sure you:  ? Wash your hands with soap and water before you change your bandage (dressing). If soap and water are not available, use hand sanitizer.  ? Change your dressing as told by your health care provider.  ? Leave stitches (sutures), skin glue, or adhesive strips in place. These skin closures may need to stay in place for 2 weeks or longer. If adhesive strip edges start to loosen and curl up, you may trim the loose edges. Do not remove adhesive strips completely unless your health care provider tells you to do that.  · Check your incision area every day for signs of infection. Check for:  ? More redness, swelling, or pain.  ? More fluid or blood.  ? Warmth.  ? Pus or a bad smell.  ? Fluid leaking from around your catheter (instead of fluid draining through your catheter).  Catheter care    · Follow instructions from your health care provider about emptying and cleaning your catheter and collection bag. You may need to clean the catheter every day so it does not clog.  · If directed, write down the following information every time you empty your bag:  ? The date and time.  ? The amount of drainage.  General instructions  · Rest at home for 1-2 days after your procedure. Return to your normal activities as told by your  health care provider.  · Do not take baths, swim, or use a hot tub for 24 hours after your procedure, or until your health care provider says that this is okay.  · Take over-the-counter and prescription medicines only as told by your health care provider.  · Keep all follow-up visits as told by your health care provider. This is important.  Contact a health care provider if:  · You have less than 10 mL of drainage a day for 2-3 days in a row, or as directed by your health care provider.  · You have more redness, swelling, or pain around your incision area.  · You have more fluid or blood coming from your incision area.  · Your incision area feels warm to the touch.  · You have pus or a bad smell coming from your incision area.  · You have fluid leaking from around your catheter (instead of through your catheter).  · You have a fever or chills.  · You have pain that does not get better with medicine.  Get help right away if:  · Your catheter comes out.  · You suddenly stop having drainage from your catheter.  · You suddenly have blood in the fluid that is draining from your catheter.  · You become dizzy or you faint.  · You develop a rash.  · You have nausea or vomiting.  ·   You have difficulty breathing or you feel short of breath.  · You develop chest pain.  · You have problems with your speech or vision.  · You have trouble balancing or moving your arms or legs.  Summary  · It is common to have a small amount of bruising and discomfort in the area where the drainage tube (catheter) was placed.  · You may be directed to record the amount of drainage from the bag every time you empty it.  · Follow instructions from your health care provider about emptying and cleaning your catheter and collection bag.  This information is not intended to replace advice given to you by your health care provider. Make sure you discuss any questions you have with your health care provider.  Document Released: 12/16/2013 Document  Revised: 06/23/2016 Document Reviewed: 06/23/2016  Elsevier Interactive Patient Education © 2019 [employer]

## 2018-10-30 NOTE — Plan of Care (Signed)
  Problem: Pain Managment: Goal: General experience of comfort will improve Outcome: Progressing   Problem: Safety: Goal: Ability to remain free from injury will improve Outcome: Progressing   Problem: Skin Integrity: Goal: Risk for impaired skin integrity will decrease Outcome: Progressing   

## 2018-10-30 NOTE — Progress Notes (Signed)
Discharged home today.Discharged instructions,personal belongings given to patient. Advised to pick up medications called oin to pharmacy of choice. Verbalized understanding of instructions

## 2018-10-30 NOTE — Discharge Summary (Signed)
[facility] Discharge Summary   Patient ID: [name] MRN: [id number] DOB/AGE: 52/28/1967 52 y.o.  Admit date: 10/27/2018 Discharge date: 10/30/2018  Admitting Diagnosis: Large intra-abdominal abscess s/p laparoscopic right inguinal hernia repair with mesh  Discharge Diagnosis Patient Active Problem List   Diagnosis Date Noted  . Intra-abdominal abscess (HCC) 10/27/2018    Consultants Interventional radiology  Imaging: Ct Image Guided Drainage By Percutaneous Catheter  Result Date: 10/28/2018 INDICATION: History of inguinal hernia repair on 10/04/2018 now with large intra-abdominal abscess. Please perform CT-guided percutaneous drainage catheter placement for infection source control purposes. EXAM: CT IMAGE GUIDED DRAINAGE BY PERCUTANEOUS CATHETER COMPARISON:  Pelvic MRI -  10/27/2018 MEDICATIONS: The patient is currently admitted to the hospital and receiving intravenous antibiotics. The antibiotics were administered within an appropriate time frame prior to the initiation of the procedure. ANESTHESIA/SEDATION: Moderate (conscious) sedation was employed during this procedure. A total of Versed 2 mg and Fentanyl 100 mcg was administered intravenously. Moderate Sedation Time: 20 minutes. The patient's level of consciousness and vital signs were monitored continuously by radiology nursing throughout the procedure under my direct supervision. CONTRAST:  None COMPLICATIONS: None immediate. PROCEDURE: Informed written consent was obtained from the patient after a discussion of the risks, benefits and alternatives to treatment. The patient was placed supine on the CT gantry and a pre procedural CT was performed re-demonstrating the known abscess/fluid collection within the right mid and lower abdomen with dominant serpiginous component measuring at least 16.7 x 10.6 cm (57, series 2). The procedure was planned. A timeout was performed prior to the initiation of the  procedure. The skin overlying the right inferolateral abdomen was prepped and draped in the usual sterile fashion. The overlying soft tissues were anesthetized with 1% lidocaine with epinephrine. Appropriate trajectory was planned with the use of a 22 gauge spinal needle. An 18 gauge trocar needle was advanced into the abscess/fluid collection and a short Amplatz super stiff wire was coiled within the collection. Appropriate positioning was confirmed with a limited CT scan. The tract was serially dilated allowing placement of a 10 [place] all-purpose drainage catheter. Appropriate positioning was confirmed with a limited postprocedural CT scan. Approximately 1.1 L of purulent, foul smelling fluid was aspirated. The tube was connected to a drainage bag and sutured in place. A dressing was placed. The patient tolerated the procedure well without immediate post procedural complication. IMPRESSION: Successful CT guided placement of a 10 French all purpose drain catheter into the large abscess located within the right mid/lower abdomen with aspiration of approximately 1.1 L of purulent fluid. Samples were sent to the laboratory as requested by the ordering clinical team. Electronically Signed   By: Simonne Come M.D.   On: 10/28/2018 16:33    Procedures Dr. Grace Isaac (10/28/18) - CT guided placed of a 10 Fr drainage catheter placement into the right lower abdomen   Hospital Course:  [name] is a 53yo female who previously underwent laparoscopic right inguinal hernia repair by Dr. Michaell Cowing on 10/04/2018. Patient states that since that time she had right-sided abdominal pain and weakness of her right lower extremity.  She was recently sent for MRI of her abdomen and pelvis which revealed a large 17 x 12 x 12 cm intra-abdominal abscess.  Patient was at that time recommended to proceed to the ER for further evaluation and management. Patient was admitted to the general surgery service. Interventional radiology was  consulted and placed a 10 French drainage catheter into the right lower  abdomen yielding 1.1 L of purulent fluid. Tolerated procedure well and was transferred to the floor.  Diet was advanced as tolerated. She was noted to be anemic 3/15 requiring 2 units PRBCs. Hemoglobin rose appropriately following transfusion. On 3/17 the patient was voiding well, tolerating diet, ambulating well, pain well controlled, vital signs stable and felt stable for discharge home. She will go home with the drain. Cultures pending at time of discharge therefore she was sent home with a 10 day supply of augmentin and doxycycline for broad coverage. Patient will follow up as below and knows to call with questions or concerns.    I have personally reviewed the patients medication history on the [place] controlled substance database.    Allergies as of 10/30/2018   No Known Allergies     Medication List    TAKE these medications   acetaminophen 325 MG tablet Commonly known as:  TYLENOL Take 2 tablets (650 mg total) by mouth every 6 (six) hours as needed for moderate pain, fever or headache.   amLODipine 5 MG tablet Commonly known as:  NORVASC Take 5 mg by mouth daily.   amoxicillin-clavulanate 875-125 MG tablet Commonly known as:  Augmentin Take 1 tablet by mouth 2 (two) times daily for 10 days.   doxycycline 50 MG capsule Commonly known as:  VIBRAMYCIN Take 1 capsule (50 mg total) by mouth 2 (two) times daily for 10 days.   losartan 100 MG tablet Commonly known as:  COZAAR Take 100 mg by mouth daily.   naproxen 500 MG tablet Commonly known as:  NAPROSYN Take 500 mg by mouth 2 (two) times daily as needed for mild pain.   oxyCODONE 5 MG immediate release tablet Commonly known as:  Oxy IR/ROXICODONE Take 1 tablet (5 mg total) by mouth every 6 (six) hours as needed for severe pain. What changed:  how much to take   sodium chloride flush 0.9 % Soln Commonly known as:  NS 5 mLs by Intracatheter route every 8  (eight) hours.        Follow-up Information    Simonne Come, MD Follow up in 1 week(s).   Specialties:  Interventional Radiology, Radiology Why:  will need follow up with Dr Grace Isaac 7-10 days; scheduler will call pt with time and date; call [phone] if needs Contact information: [address] [place] [address] [phone]        Karie Soda, MD. Go on 11/05/2018.   Specialty:  General Surgery Why:  You have a follow up appointment scheduled.  Contact information: [address] [place] [address] [phone]           Signed: Franne Forts, [facility] 10/30/2018, 2:29 PM Pager: [phone] Mon-Thurs 7:00 am-4:30 pm Fri 7:00 am -11:30 AM Sat-Sun 7:00 am-11:30 am

## 2018-10-31 ENCOUNTER — Other Ambulatory Visit: Payer: Self-pay | Admitting: *Deleted

## 2018-10-31 MED FILL — NORMAL SALINE FLUSH SYRINGE: 0.9 | 5 days supply | Qty: 150 | Fill #0

## 2018-10-31 NOTE — Patient Outreach (Signed)
[facility]) Care Management  10/31/2018  [name] 11/11/1965 [id number]   Transition of care call   Referral received: 10/31/18 Initial outreach: 10/31/18 Insurance:  Choice Plan   Subjective: Initial successful telephone call to patient's preferred number in order to complete transition of care assessment; 2 HIPAA identifiers verified. Explained purpose of call and completed transition of care assessment.  [name] states she is doing well, some ongoing right sided abdominal pain which she is treating with the prescribed oxycodone, states the abdominal drain is working well and she voices understanding of its care, tolerating  diet , denies bowel or bladder problems. She says she was instructed to wait until the interventional radiology scheulder calls her to arrange a follow up abdominal scan before she makes the appointment with her surgeon.    Objective:  [name] was hospitalized at [facility] from 3/14-3/17/2020 for a paeritoneal abscess. She had laparoscopic right inguinal hernia repair with mesh on 10/04/18. She was discharged to home on 10/30/18 without the need for home health services or DME, with a 10 Fr drainage catheter placed in the right loswer abdomen.    Assessment:  Patient voices good understanding of all discharge instructions.  See transition of care flowsheet for assessment details.   Plan:  No ongoing care management needs identified so will close case to [facility] Care Management care management services and route successful outreach letter with [facility] Care Management pamphlet and 24 Hour Nurse Line Magnet to [employer] Care Management clinical pool to be mailed to patient's home address.

## 2018-11-01 LAB — CULTURE, BLOOD (ROUTINE X 2)
Culture: NO GROWTH
Culture: NO GROWTH
Special Requests: ADEQUATE
Special Requests: ADEQUATE

## 2018-11-02 ENCOUNTER — Other Ambulatory Visit: Payer: Self-pay | Admitting: Surgery

## 2018-11-02 DIAGNOSIS — K651 Peritoneal abscess: Secondary | ICD-10-CM

## 2018-11-02 LAB — AEROBIC/ANAEROBIC CULTURE W GRAM STAIN (SURGICAL/DEEP WOUND)

## 2018-11-02 LAB — AEROBIC/ANAEROBIC CULTURE (SURGICAL/DEEP WOUND)

## 2018-11-02 MED FILL — oxyCODONE HCL 5 MG TABS: 5 | 5 days supply | Qty: 40 | Fill #0

## 2018-11-06 MED FILL — AMOX-CLAV 875-125 MG TABLET: 875-125 | 42 days supply | Qty: 84 | Fill #0

## 2018-11-07 ENCOUNTER — Ambulatory Visit
Admission: RE | Admit: 2018-11-07 | Discharge: 2018-11-07 | Disposition: A | Discharge status: Home / Self Care | Payer: 59 | Source: Ambulatory Visit | Attending: Surgery | Admitting: Surgery

## 2018-11-07 ENCOUNTER — Encounter (HOSPITAL_COMMUNITY): Payer: Self-pay

## 2018-11-07 ENCOUNTER — Encounter: Payer: Self-pay | Admitting: Radiology

## 2018-11-07 ENCOUNTER — Other Ambulatory Visit: Payer: Self-pay | Admitting: Surgery

## 2018-11-07 ENCOUNTER — Ambulatory Visit
Admission: RE | Admit: 2018-11-07 | Discharge: 2018-11-07 | Disposition: A | Discharge status: Home / Self Care | Payer: 59 | Source: Ambulatory Visit | Attending: Radiology | Admitting: Radiology

## 2018-11-07 DIAGNOSIS — K651 Peritoneal abscess: Secondary | ICD-10-CM

## 2018-11-07 HISTORY — PX: IR RADIOLOGIST EVAL & MGMT: IMG5224

## 2018-11-07 MED ORDER — IOPAMIDOL (ISOVUE-300) INJECTION 61%
100.0000 mL | Freq: Once | INTRAVENOUS | Status: AC | PRN
  Administered 2018-11-07: 100 mL via INTRAVENOUS

## 2018-11-07 MED FILL — NORMAL SALINE FLUSH SYRINGE: 0.9 | 5 days supply | Qty: 150 | Fill #1

## 2018-11-07 NOTE — Progress Notes (Signed)
Referring Physician(s): Dr Gordy Savers  Chief Complaint: The patient is seen in follow up today s/p intra abdominal abscess drain placed in IR 10/28/18  History of present illness:  Large intra-abdominal abscess s/p laparoscopic right inguinal hernia repair with mesh. Previously underwent laparoscopic right inguinal hernia repair by Dr. Michaell Cowing on 10/04/2018. Developed abd pain; fever. CT revealed abscess Drain placed in IR 10/28/18  Scheduled today for CT and evaluation  Pt is still painful in RLQ Has been really for months Denies fever/chills Still taking Augmentin and Doxy Will finish Doxy tomorrow Will continue Augmentin x 6 weeks per pt.  She is to see Dr Michaell Cowing Monday   Past Medical History:  Diagnosis Date   Hypertension     Past Surgical History:  Procedure Laterality Date   HERNIA REPAIR      Allergies: Patient has no known allergies.  Medications: Prior to Admission medications   Medication Sig Start Date End Date Taking? Authorizing Provider  acetaminophen (TYLENOL) 325 MG tablet Take 2 tablets (650 mg total) by mouth every 6 (six) hours as needed for moderate pain, fever or headache. Patient not taking: Reported on 10/31/2018 10/30/18   Carlena Bjornstad A, PA-C  amLODipine (NORVASC) 5 MG tablet Take 5 mg by mouth daily.    [provider]  amoxicillin-clavulanate (AUGMENTIN) 875-125 MG tablet Take 1 tablet by mouth 2 (two) times daily for 10 days. 10/30/18 11/09/18  Meuth, Lina Sar, PA-C  doxycycline (VIBRAMYCIN) 50 MG capsule Take 1 capsule (50 mg total) by mouth 2 (two) times daily for 10 days. 10/30/18 11/09/18  Meuth, Brooke A, PA-C  losartan (COZAAR) 100 MG tablet Take 100 mg by mouth daily.    [provider]  naproxen (NAPROSYN) 500 MG tablet Take 500 mg by mouth 2 (two) times daily as needed for mild pain.    [provider]  oxyCODONE (OXY IR/ROXICODONE) 5 MG immediate release tablet Take 1 tablet (5 mg total) by mouth every 6 (six)  hours as needed for severe pain. 10/30/18   Meuth, Brooke A, PA-C  sodium chloride flush (NS) 0.9 % SOLN 5 mLs by Intracatheter route every 8 (eight) hours. 10/30/18   Rayburn, Alphonsus Sias, PA-C     No family history on file.  Social History   Socioeconomic History   Marital status: Married    Spouse name: Not on file   Number of children: Not on file   Years of education: Not on file   Highest education level: Not on file  Occupational History   Not on file  Social Needs   Financial resource strain: Not on file   Food insecurity:    Worry: Not on file    Inability: Not on file   Transportation needs:    Medical: Not on file    Non-medical: Not on file  Tobacco Use   Smoking status: Never Smoker   Smokeless tobacco: Never Used  Substance and Sexual Activity   Alcohol use: Never    Frequency: Never   Drug use: Never   Sexual activity: Not on file  Lifestyle   Physical activity:    Days per week: Not on file    Minutes per session: Not on file   Stress: Not on file  Relationships   Social connections:    Talks on phone: Not on file    Gets together: Not on file    Attends religious service: Not on file    Active member of club or organization:  Not on file    Attends meetings of clubs or organizations: Not on file    Relationship status: Not on file  Other Topics Concern   Not on file  Social History Narrative   Not on file     Vital Signs: Temp: 98.4  Physical Exam Skin:    General: Skin is warm and dry.     Comments: Site is clean and dry Tender No bleeding OP is milky brown Scant in bag   CT today reveals resolution of collection Injection reveals NO Communication to bowel per Dr Bonnielee Haff   Imaging: Ct Abdomen Pelvis W Contrast  Result Date: 11/07/2018 CLINICAL DATA:  Follow-up abscess drain EXAM: CT ABDOMEN AND PELVIS WITH CONTRAST TECHNIQUE: Multidetector CT imaging of the abdomen and pelvis was performed using the standard protocol  following bolus administration of intravenous contrast. CONTRAST:  ISOVUE-300 IOPAMIDOL (ISOVUE-300) INJECTION 61% COMPARISON:  10/27/2018 FINDINGS: Lower chest: No acute abnormality. Hepatobiliary: Unremarkable Pancreas: Unremarkable Spleen: Unremarkable Adrenals/Urinary Tract: Kidneys, adrenal gland are unremarkable. Bladder is moderately distended. Stomach/Bowel: Moderate stool burden in the ascending and transverse colon. No disproportionate dilatation of small bowel. Stomach is unremarkable. Vascular/Lymphatic: Small retroperitoneal nodes. No evidence of aortic aneurysm. 1.1 cm short axis diameter right inguinal lymph node is likely inflammatory. Reproductive: Unremarkable uterus. Other: A drain has been placed in the previously visualized right lower quadrant large abscess. The abscess has completely decompressed. The drain is positioned in the right lower quadrant peritoneum. Inflammatory changes in the adjacent fat persist. There is subtle fluid within the right pelvic sidewall musculature. See image 65 series 2. There is also a small right inguinal fluid collection measuring 2.0 cm. Musculoskeletal: No vertebral compression. IMPRESSION: The large abscess has virtually resolved after drain placement. There continues to be inflammatory changes in the right lower quadrant peritoneal fat as well as subtle fluid within the right pelvic sidewall musculature. There is also a small right inguinal fluid collection. Electronically Signed   By: Jolaine Click M.D.   On: 11/07/2018 14:44   Dg Sinus/fist Tube Chk-non Gi  Result Date: 11/07/2018 INDICATION: Follow-up abscess drain EXAM: DRAIN INJECTION MEDICATIONS: None ANESTHESIA/SEDATION: None COMPLICATIONS: None immediate. PROCEDURE: Informed written consent was obtained from the patient after a thorough discussion of the procedural risks, benefits and alternatives. All questions were addressed. Maximal Sterile Barrier Technique was utilized including caps,  mask, sterile gowns, sterile gloves, sterile drape, hand hygiene and skin antiseptic. A timeout was performed prior to the initiation of the procedure. Contrast was injected into the right lower quadrant abscess drain and imaging was obtained. FINDINGS: Contrast fills a potential space in the right lower quadrant with some irregularity of the cavity. There is no evidence of fistula to adjacent bowel. The previously visualized abscess has virtually resolved. IMPRESSION: The previously visualized abscess has completely decompressed. Contrast fills a small area in the right lower quadrant and there is no evidence of fistula. Electronically Signed   By: Jolaine Click M.D.   On: 11/07/2018 14:45    Labs:  CBC: Recent Labs    10/27/18 1428 10/28/18 0344 10/29/18 0853 10/30/18 1042  WBC 11.9* 12.0* 9.8 9.2  HGB 8.1* 7.1* 10.2* 10.5*  HCT 26.6* 22.4* 30.5* 32.6*  PLT 569* 501* 523* 493*    COAGS: Recent Labs    10/28/18 0938  INR 1.4*    BMP: Recent Labs    10/27/18 1428 10/29/18 0853 10/30/18 0333  NA 133* 138  --   K 3.5 3.3*  --  CL 96* 102  --   CO2 25 27  --   GLUCOSE 142* 104*  --   BUN 16 <5*  --   CALCIUM 9.3 8.6*  --   CREATININE 1.15* 0.68 0.68  GFRNONAA 55* >60 >60  GFRAA >60 >60 >60    LIVER FUNCTION TESTS: Recent Labs    10/27/18 1428  BILITOT 0.9  AST 25  ALT 37  ALKPHOS 154*  PROT 7.4  ALBUMIN 2.6*    Assessment:  Intra abd abscess post inguinal hernia repair Drain placed in IR 3/15 Ct shows resolution of collection Injection reveals NO communication to bowel OP is milky brown Discussed with Dr Bonnielee Haff secondary OP color Will keep drain for now Flush once a day Re visit in 1 week-- injection only next week Hope for removal at that time - if OP is clear Pt has good understanding of plan  Signed: Robet Leu, PA-C 11/07/2018, 2:56 PM   Please refer to Dr. Bonnielee Haff attestation of this note for management and plan.

## 2018-11-08 ENCOUNTER — Other Ambulatory Visit: Payer: Self-pay | Admitting: Surgery

## 2018-11-08 DIAGNOSIS — K651 Peritoneal abscess: Secondary | ICD-10-CM

## 2018-11-14 ENCOUNTER — Other Ambulatory Visit: Payer: Self-pay

## 2018-11-14 ENCOUNTER — Ambulatory Visit
Admission: RE | Admit: 2018-11-14 | Discharge: 2018-11-14 | Disposition: A | Discharge status: Home / Self Care | Payer: 59 | Source: Ambulatory Visit | Attending: Surgery | Admitting: Surgery

## 2018-11-14 ENCOUNTER — Other Ambulatory Visit: Payer: 59

## 2018-11-14 ENCOUNTER — Encounter: Payer: Self-pay | Admitting: Interventional Radiology

## 2018-11-14 DIAGNOSIS — K651 Peritoneal abscess: Secondary | ICD-10-CM

## 2018-11-14 DIAGNOSIS — K6811 Postprocedural retroperitoneal abscess: Secondary | ICD-10-CM | POA: Diagnosis not present

## 2018-11-14 HISTORY — PX: IR RADIOLOGIST EVAL & MGMT: IMG5224

## 2018-11-14 NOTE — Progress Notes (Signed)
Referring Physician(s): Gross,Steven  Chief Complaint: The patient is seen in follow up today s/p intra abdominal abscess drain placed in IR 10/28/18  History of present illness:  Large intra-abdominal abscesss/plaparoscopic right inguinal hernia repair with mesh. Previously underwent laparoscopic right inguinal hernia repair by Dr.Gross on 10/04/2018. Developed abd pain; fever. CT revealed abscess Drain placed in IR 10/28/18  Was last seen in Radiology OP Clinic 11/07/18 CT revealed No collection; injection revealed No communication But OP was milky brown Was determined by Dr Bonnielee Haff to keep drain 1 more week Continue to flush Pt has complied to instructions OP is scant to none now Color more serous at this point  Denies pain; denies chills Denies fever  appt with Dr Michaell Cowing on 3/29 was cancelled per pt-- secondary transportation Now scheduled with Dr Michaell Cowing 4/14  Scheduled today for another injection  Past Medical History:  Diagnosis Date  . Abnormal pap   . Cervicitis   . Endometrial polyp   . Hypertension   . Ovarian cyst, right     Past Surgical History:  Procedure Laterality Date  . CESAREAN SECTION  05/12/1998  . CESAREAN SECTION  11/04/1999  . CESAREAN SECTION  02/09/2006  . COLPOSCOPY  10/13/03  . HERNIA REPAIR    . INGUINAL HERNIA REPAIR Right 10/04/2018   Procedure: LAPAROSCOPIC BILATERAL INGUINAL HERNIA REPAIR WITH MESH INCISON OF CYST MASS RIGHT ROUND LIGAMENT WITH TAP BLOCK;  Surgeon: Karie Soda, MD;  Location: [facility];  Service: General;  Laterality: Right;  ERAS PATHWAY  . IR RADIOLOGIST EVAL & MGMT  11/07/2018    Allergies: Patient has no known allergies.  Medications: Prior to Admission medications   Medication Sig Start Date End Date Taking? Authorizing Provider  acetaminophen (TYLENOL) 325 MG tablet Take 2 tablets (650 mg total) by mouth every 6 (six) hours as needed for moderate pain, fever or headache. Patient not taking: Reported on  10/31/2018 10/30/18   Carlena Bjornstad A, PA-C  amLODipine (NORVASC) 5 MG tablet Take 5 mg by mouth daily.    [provider]  amLODipine (NORVASC) 5 MG tablet Take 5 mg by mouth daily.    [provider]  cholecalciferol (VITAMIN D3) 25 MCG (1000 UT) tablet Take 1,000 Units by mouth daily.    [provider]  losartan (COZAAR) 100 MG tablet Take 100 mg by mouth daily.     [provider]  losartan (COZAAR) 100 MG tablet Take 100 mg by mouth daily.    [provider]  naproxen (NAPROSYN) 500 MG tablet Take 1 tablet (500 mg total) by mouth every 12 (twelve) hours as needed for moderate pain or headache. 10/04/18   Karie Soda, MD  naproxen (NAPROSYN) 500 MG tablet Take 500 mg by mouth 2 (two) times daily as needed for mild pain.    [provider]  oxyCODONE (OXY IR/ROXICODONE) 5 MG immediate release tablet Take 1 tablet (5 mg total) by mouth every 6 (six) hours as needed for severe pain. 10/30/18   Meuth, Brooke A, PA-C  sodium chloride flush (NS) 0.9 % SOLN 5 mLs by Intracatheter route every 8 (eight) hours. 10/30/18   Rayburn, Alphonsus Sias, PA-C  traMADol (ULTRAM) 50 MG tablet Take 1-2 tablets (50-100 mg total) by mouth every 6 (six) hours as needed for moderate pain or severe pain. 10/04/18   Karie Soda, MD     Family History  Problem Relation Age of Onset  . Congestive Heart Failure Mother   . Breast cancer  Sister   . Colon cancer Paternal Aunt     Social History   Socioeconomic History  . Marital status: Married    Spouse name: Not on file  . Number of children: Not on file  . Years of education: Not on file  . Highest education level: Not on file  Occupational History  . Not on file  Social Needs  . Financial resource strain: Not on file  . Food insecurity:    Worry: Not on file    Inability: Not on file  . Transportation needs:    Medical: Not on file    Non-medical: Not on file  Tobacco Use  . Smoking status: Never Smoker  .  Smokeless tobacco: Never Used  Substance and Sexual Activity  . Alcohol use: Never    Frequency: Never  . Drug use: Never  . Sexual activity: Yes    Birth control/protection: None  Lifestyle  . Physical activity:    Days per week: Not on file    Minutes per session: Not on file  . Stress: Not on file  Relationships  . Social connections:    Talks on phone: Not on file    Gets together: Not on file    Attends religious service: Not on file    Active member of club or organization: Not on file    Attends meetings of clubs or organizations: Not on file    Relationship status: Not on file  Other Topics Concern  . Not on file  Social History Narrative   ** Merged History Encounter **         Vital Signs: BP 127/74   Temp 98 F (36.7 C)   LMP 08/16/2015   SpO2 98%   Physical Exam Vitals signs reviewed.  Skin:    General: Skin is warm and dry.     Comments: Site is clean and dry No bleeding no sign of infection   Injection shows No communication to bowel per Dr Lenice Llamas removal without complication  Imaging: No results found.  Labs:  CBC: Recent Labs    10/27/18 1428 10/28/18 0344 10/29/18 0853 10/30/18 1042  WBC 11.9* 12.0* 9.8 9.2  HGB 8.1* 7.1* 10.2* 10.5*  HCT 26.6* 22.4* 30.5* 32.6*  PLT 569* 501* 523* 493*    COAGS: Recent Labs    10/28/18 0938  INR 1.4*    BMP: Recent Labs    09/27/18 1054  10/12/18 1704 10/13/18 0608 10/27/18 1428 10/29/18 0853 10/30/18 0333  NA 141  --  133*  --  133* 138  --   K 3.5   < > 3.3* 3.5 3.5 3.3*  --   CL 102  --  95*  --  96* 102  --   CO2 32  --  26  --  25 27  --   GLUCOSE 102*  --  135*  --  142* 104*  --   BUN 16  --  14  --  16 <5*  --   CALCIUM 9.5  --  8.7*  --  9.3 8.6*  --   CREATININE 0.58   < > 0.66  --  1.15* 0.68 0.68  GFRNONAA >60   < > >60  --  55* >60 >60  GFRAA >60   < > >60  --  >60 >60 >60   < > = values in this interval not displayed.    LIVER FUNCTION TESTS: Recent Labs     10/12/18 1704  10/27/18 1428  BILITOT 1.8* 0.9  AST 26 25  ALT 27 37  ALKPHOS 88 154*  PROT 8.0 7.4  ALBUMIN 4.0 2.6*    Assessment:  Intra abdominal abscess resolved Injection shows no communication to bowel No OP Dr Loreta Ave has spoken to Dr Elly Modena removal per order Pt to keep appt with Dr Michaell Cowing 11/27/18  Signed: Robet Leu, PA-C 11/14/2018, 10:56 AM   Please refer to Dr. Loreta Ave attestation of this note for management and plan.

## 2018-11-27 MED FILL — LOSARTAN POTASSIUM 100 MG T: 100 | 30 days supply | Qty: 30 | Fill #0

## 2018-11-27 MED FILL — NAPROXEN 500 MG TABLET: 500 | 15 days supply | Qty: 30 | Fill #0

## 2018-12-03 ENCOUNTER — Ambulatory Visit
Admission: RE | Admit: 2018-12-03 | Discharge: 2018-12-03 | Disposition: A | Discharge status: Home / Self Care | Payer: 59 | Source: Ambulatory Visit | Attending: Surgery | Admitting: Surgery

## 2018-12-03 ENCOUNTER — Other Ambulatory Visit: Payer: Self-pay | Admitting: Surgery

## 2018-12-03 ENCOUNTER — Other Ambulatory Visit: Payer: Self-pay

## 2018-12-03 DIAGNOSIS — R109 Unspecified abdominal pain: Secondary | ICD-10-CM

## 2018-12-03 MED ORDER — IOPAMIDOL (ISOVUE-300) INJECTION 61%
100.0000 mL | Freq: Once | INTRAVENOUS | Status: AC | PRN
  Administered 2018-12-03: 15:00:00 100 mL via INTRAVENOUS

## 2018-12-11 DIAGNOSIS — T8149XA Infection following a procedure, other surgical site, initial encounter: Secondary | ICD-10-CM | POA: Diagnosis not present

## 2018-12-11 DIAGNOSIS — Z09 Encounter for follow-up examination after completed treatment for conditions other than malignant neoplasm: Secondary | ICD-10-CM | POA: Diagnosis not present

## 2018-12-23 MED FILL — NAPROXEN 500 MG TABLET: 500 | 15 days supply | Qty: 30 | Fill #1

## 2018-12-29 MED FILL — LOSARTAN POTASSIUM 100 MG T: 100 | 30 days supply | Qty: 30 | Fill #1

## 2018-12-29 MED FILL — AMLODIPINE BESYLATE 5 MG TA: 5 | 30 days supply | Qty: 30 | Fill #0

## 2019-01-30 MED FILL — LOSARTAN POTASSIUM 100 MG T: 100 | 30 days supply | Qty: 30 | Fill #0

## 2019-01-30 MED FILL — AMLODIPINE BESYLATE 5 MG TA: 5 | 30 days supply | Qty: 30 | Fill #0

## 2019-02-06 DIAGNOSIS — T8149XA Infection following a procedure, other surgical site, initial encounter: Secondary | ICD-10-CM | POA: Diagnosis not present

## 2019-02-06 DIAGNOSIS — Z09 Encounter for follow-up examination after completed treatment for conditions other than malignant neoplasm: Secondary | ICD-10-CM | POA: Diagnosis not present

## 2019-02-06 MED FILL — NAPROXEN 500 MG TABLET: 500 | 15 days supply | Qty: 30 | Fill #0

## 2019-03-05 MED FILL — LOSARTAN POTASSIUM 100 MG T: 100 | 30 days supply | Qty: 30 | Fill #1

## 2019-03-05 MED FILL — AMLODIPINE BESYLATE 5 MG TA: 5 | 30 days supply | Qty: 30 | Fill #1

## 2019-04-03 MED FILL — LOSARTAN POTASSIUM 100 MG T: 100 | 30 days supply | Qty: 30 | Fill #2

## 2019-04-03 MED FILL — AMLODIPINE BESYLATE 5 MG TA: 5 | 30 days supply | Qty: 30 | Fill #2

## 2019-05-06 MED FILL — LOSARTAN POTASSIUM 100 MG T: 100 | 30 days supply | Qty: 30 | Fill #3

## 2019-05-06 MED FILL — AMLODIPINE BESYLATE 5 MG TA: 5 | 30 days supply | Qty: 30 | Fill #3

## 2019-05-27 DIAGNOSIS — I10 Essential (primary) hypertension: Secondary | ICD-10-CM | POA: Diagnosis not present

## 2019-05-30 MED FILL — LOSARTAN POTASSIUM 100 MG T: 100 | 90 days supply | Qty: 90 | Fill #0

## 2019-05-30 MED FILL — AMLODIPINE BESYLATE 5 MG TA: 5 | 90 days supply | Qty: 90 | Fill #0

## 2019-09-04 MED FILL — LOSARTAN POTASSIUM 100 MG T: 100 | 90 days supply | Qty: 90 | Fill #1

## 2019-09-04 MED FILL — AMLODIPINE BESYLATE 5 MG TA: 5 | 90 days supply | Qty: 90 | Fill #1

## 2019-10-21 DIAGNOSIS — S0502XA Injury of conjunctiva and corneal abrasion without foreign body, left eye, initial encounter: Secondary | ICD-10-CM | POA: Diagnosis not present

## 2019-10-21 MED FILL — MOXIFLOXACIN HCL 0.5 % SOLN: 0.5 | 20 days supply | Qty: 3 | Fill #0

## 2019-10-22 DIAGNOSIS — S0502XD Injury of conjunctiva and corneal abrasion without foreign body, left eye, subsequent encounter: Secondary | ICD-10-CM | POA: Diagnosis not present

## 2019-10-26 DIAGNOSIS — I10 Essential (primary) hypertension: Secondary | ICD-10-CM | POA: Diagnosis not present

## 2019-10-31 DIAGNOSIS — R799 Abnormal finding of blood chemistry, unspecified: Secondary | ICD-10-CM | POA: Diagnosis not present

## 2019-10-31 DIAGNOSIS — R7309 Other abnormal glucose: Secondary | ICD-10-CM | POA: Diagnosis not present

## 2019-10-31 DIAGNOSIS — I10 Essential (primary) hypertension: Secondary | ICD-10-CM | POA: Diagnosis not present

## 2019-11-21 DIAGNOSIS — H5213 Myopia, bilateral: Secondary | ICD-10-CM | POA: Diagnosis not present

## 2019-12-10 MED FILL — AMLODIPINE BESYLATE 5 MG TA: 5 | 90 days supply | Qty: 90 | Fill #0

## 2019-12-10 MED FILL — LOSARTAN POTASSIUM 100 MG T: 100 | 90 days supply | Qty: 90 | Fill #0

## 2020-01-16 DIAGNOSIS — M25552 Pain in left hip: Secondary | ICD-10-CM | POA: Diagnosis not present

## 2020-01-16 MED FILL — MELOXICAM 15 MG TABLET: 15 | 30 days supply | Qty: 30 | Fill #0

## 2020-02-12 MED FILL — MELOXICAM 15 MG TABLET: 15 | 30 days supply | Qty: 30 | Fill #1

## 2020-03-10 MED FILL — MELOXICAM 15 MG TABLET: 15 | 30 days supply | Qty: 30 | Fill #2

## 2020-03-10 MED FILL — LOSARTAN POTASSIUM 100 MG T: 100 | 90 days supply | Qty: 90 | Fill #1

## 2020-03-10 MED FILL — AMLODIPINE BESYLATE 5 MG TA: 5 | 90 days supply | Qty: 90 | Fill #1

## 2020-03-26 DIAGNOSIS — Z1239 Encounter for other screening for malignant neoplasm of breast: Secondary | ICD-10-CM | POA: Diagnosis not present

## 2020-03-26 DIAGNOSIS — Z01419 Encounter for gynecological examination (general) (routine) without abnormal findings: Secondary | ICD-10-CM | POA: Diagnosis not present

## 2020-04-02 DIAGNOSIS — K219 Gastro-esophageal reflux disease without esophagitis: Secondary | ICD-10-CM | POA: Diagnosis not present

## 2020-04-02 DIAGNOSIS — Z1211 Encounter for screening for malignant neoplasm of colon: Secondary | ICD-10-CM | POA: Diagnosis not present

## 2020-04-02 MED FILL — CLENPIQ 10-3.5-12 MG-GM -GM: 10-3.5-12 M | 2 days supply | Qty: 320 | Fill #0

## 2020-04-09 IMAGING — MR MRI PELVIS WITHOUT AND WITH CONTRAST
7 of 9 series · 36 of 48 positions shown · IV contrast (9 ml multihance)
Comparison: CT scan 01/01/2014

CLINICAL DATA: Severe abdominal pain. History of hernia repair
10/04/2018.

EXAM:
MRI PELVIS WITHOUT AND WITH CONTRAST
TECHNIQUE: Multiplanar multisequence MR imaging of the pelvis was performed
both before and after administration of intravenous contrast.
CONTRAST:  15mL MULTIHANCE GADOBENATE DIMEGLUMINE 529 MG/ML IV SOLN

[Series 3: T2 fat-sat · sagittal · 4.0mm · 0.51mm/px · 6 of 49 slices shown (1 of 2)]
[im 1/49]
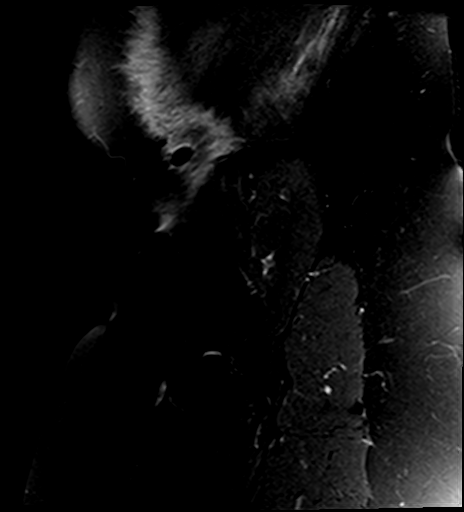
[im 10/49]
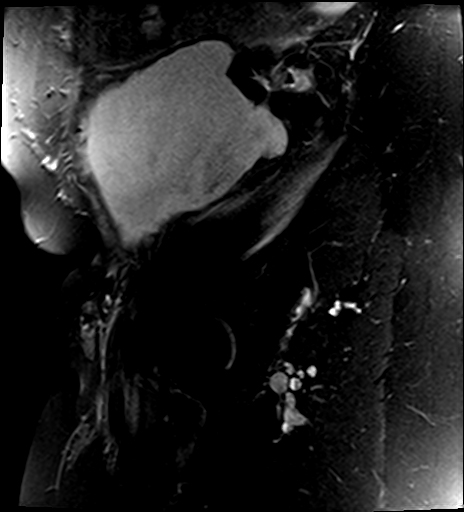
[im 20/49]
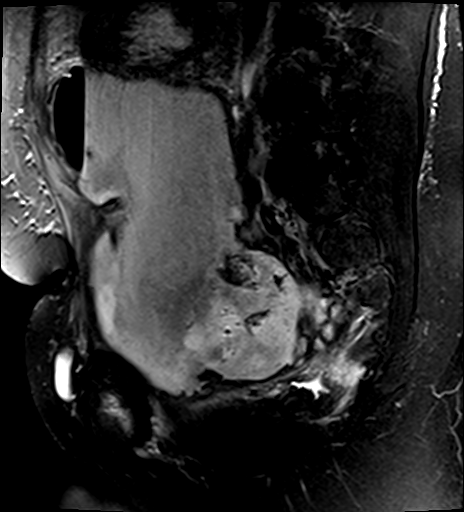
[im 29/49]
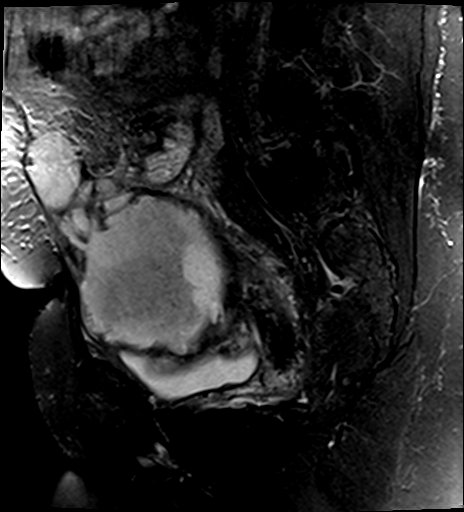
[im 39/49]
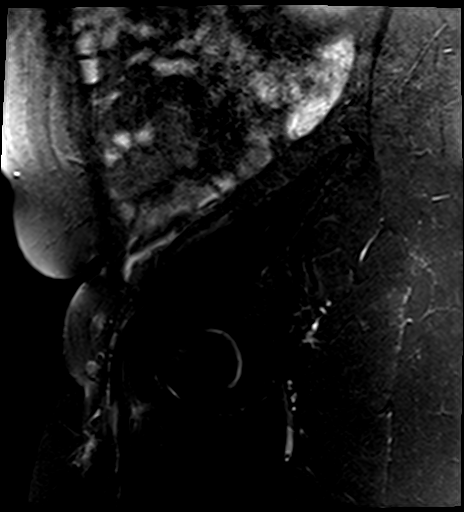
[im 49/49]
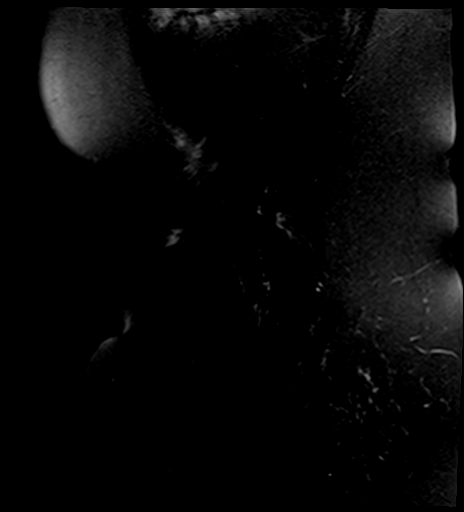

[Series 4: T1 · coronal · 4.0mm · 1.56mm/px · 4 of 38 slices shown (1 of 2)]
[im 1/38]
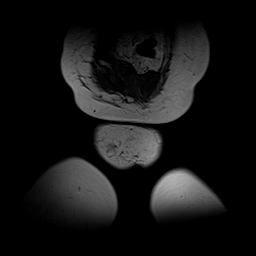
[im 13/38]
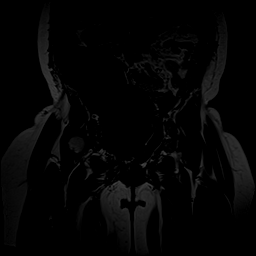
[im 25/38]
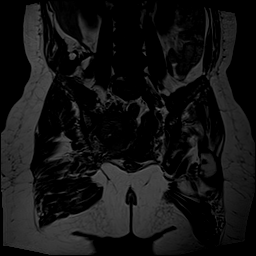
[im 38/38]
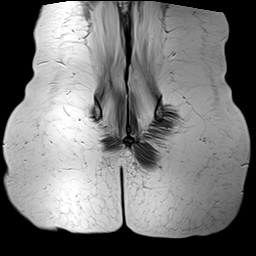

[Series 5: STIR · coronal · 4.0mm · 1.56mm/px · 4 of 34 slices shown]
[im 1/34]
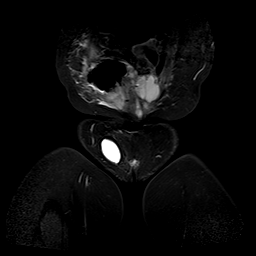
[im 12/34]
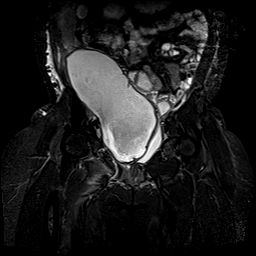
[im 23/34]
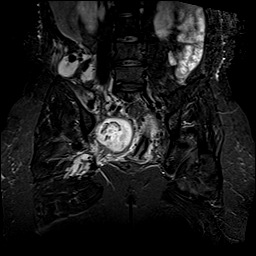
[im 34/34]
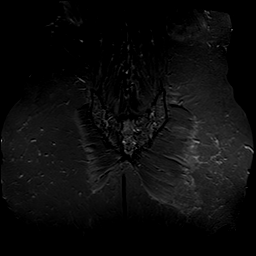

[Series 6: T1 · axial · 4.0mm · 0.59mm/px · z∈[-108,+162]mm · 6 of 55 slices shown (2 of 2)]
[im 1/55]
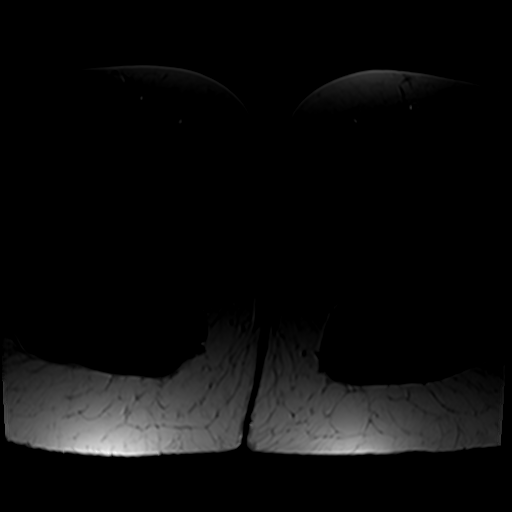
[im 11/55]
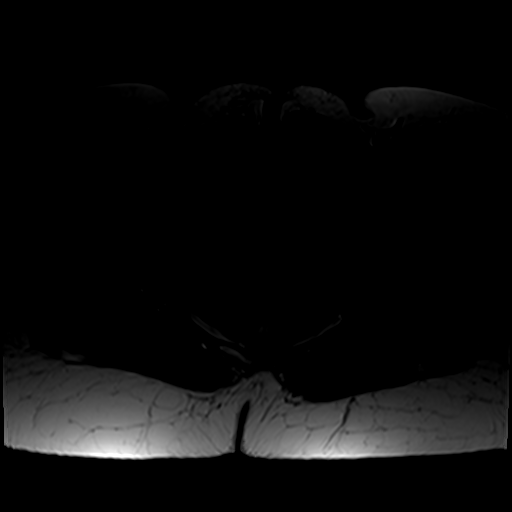
[im 22/55]
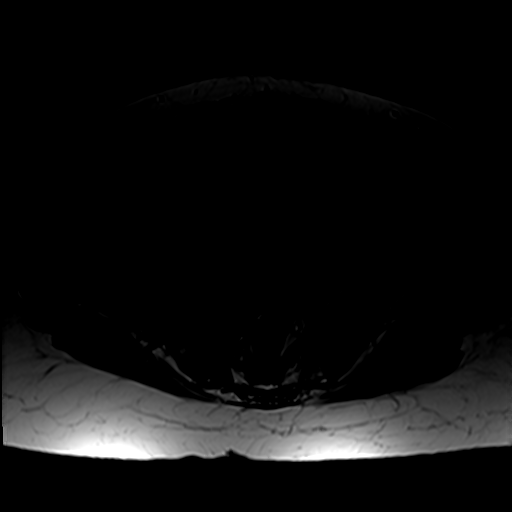
[im 33/55]
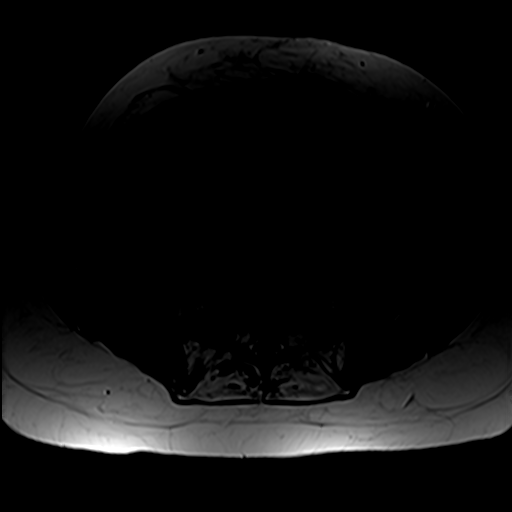
[im 44/55]
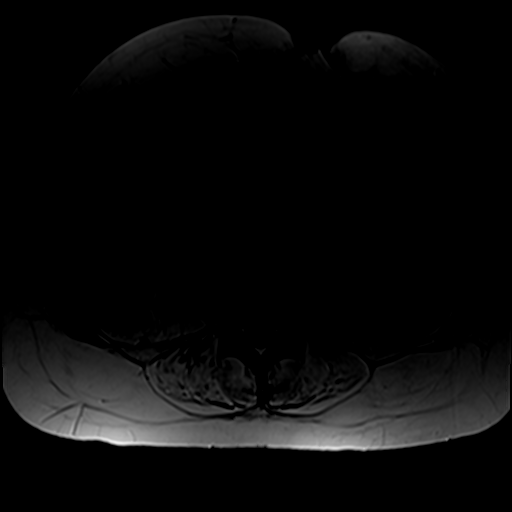
[im 55/55]
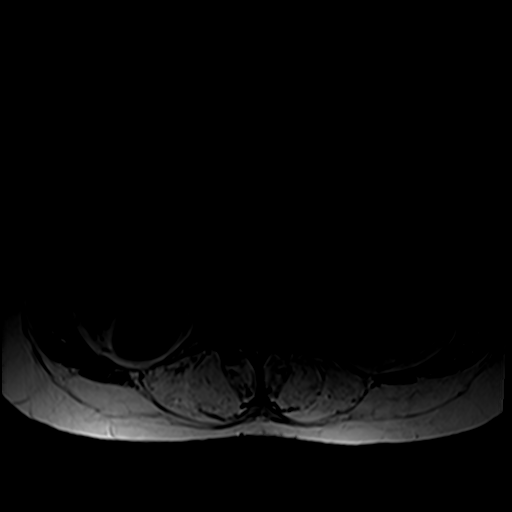

[Series 7: T2 fat-sat · axial · 4.0mm · 1.17mm/px · z∈[-108,+162]mm · 6 of 55 slices shown (2 of 2)]
[im 1/55]
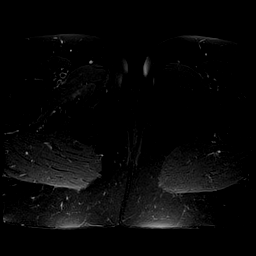
[im 11/55]
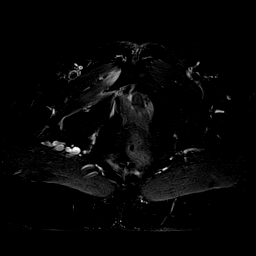
[im 22/55]
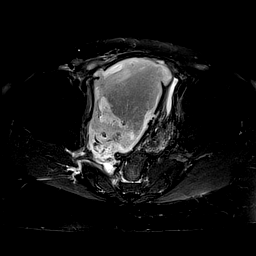
[im 33/55]
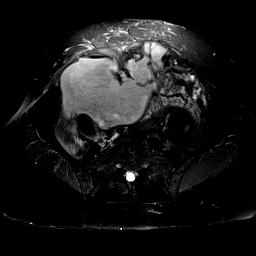
[im 44/55]
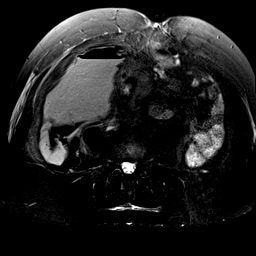
[im 55/55]
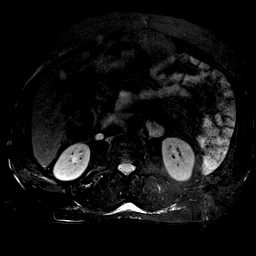

[Series 8: T1 fat-sat · axial · 4.0mm · 1.17mm/px · z∈[-108,+162]mm · 6 of 55 slices shown (1 of 2)]
[im 1/55]
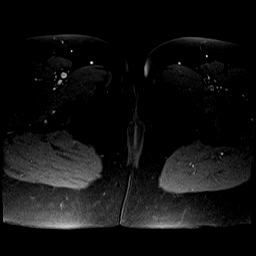
[im 11/55]
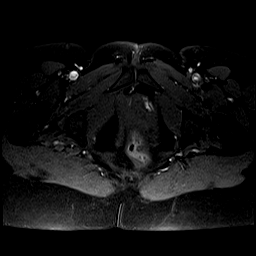
[im 22/55]
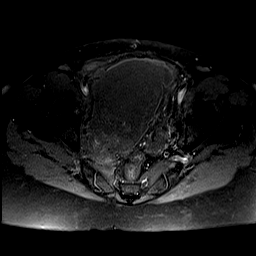
[im 33/55]
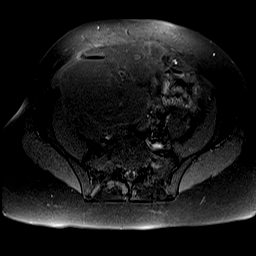
[im 44/55]
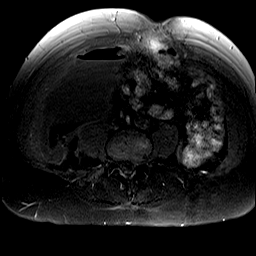
[im 55/55]
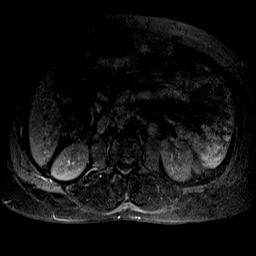

[Series 9: T1 fat-sat · axial · 4.0mm · 1.17mm/px · z∈[-108,+52]mm · 4 of 55 slices shown (2 of 2)]
[im 1/55]
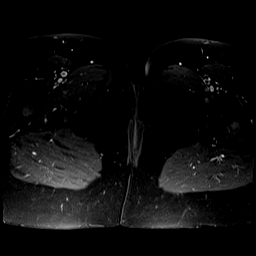
[im 11/55]
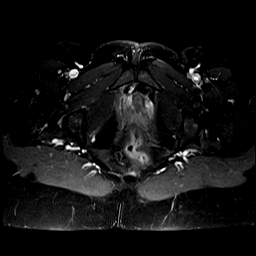
[im 22/55]
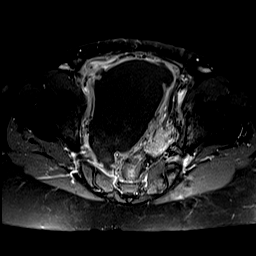
[im 33/55]
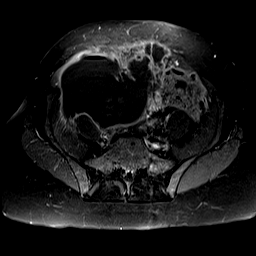

[36 of 48 positions shown; findings below may reference images not displayed]

FINDINGS: Urinary Tract: The bladder is grossly normal. It is moderately
compressed and displaced to the left by a large right-sided pelvic
abscess/infected hematoma.

Bowel: The visualized small bowel and colon are grossly normal. No
findings for obstruction.

Vascular/Lymphatic: The major vascular structures appear normal.
Small scattered pelvic and lower retroperitoneal lymph nodes.

Reproductive: Grossly normal. The uterus is displaced to the left.
Small fibroids are noted.

Other: There is a very large rim enhancing pelvic abscess occupying
the right side of the pelvis and also the right lower quadrant of
the abdomen. It measures approximately 17 x 12 x 12 cm and contains
gas.

There is also a smaller abscess noted in the midline of the abdomen
which could communicate with this larger abscess. It measures a
maximum of 3.7 cm.

Musculoskeletal: No significant bony findings.
IMPRESSION: 1. Large right-sided pelvic and right lower quadrant postoperative
abscess measuring 17 x 12 x 12 cm. Smaller adjacent abscess
anteriorly [DATE]. Mass effect on the uterus and bladder which are displaced to the
left.
3. Scattered reactive pelvic and lower retroperitoneal lymph nodes.
4. Stable right inguinal fluid collection when compared to prior CT
scan from 3575.

I discussed these findings with the patient and sent her to the
emergency room with her husband. I called the nursing supervisor in
the ED and explain the patient's situation and need for surgical
consultation.

## 2020-04-25 DIAGNOSIS — E785 Hyperlipidemia, unspecified: Secondary | ICD-10-CM | POA: Diagnosis not present

## 2020-04-25 DIAGNOSIS — I10 Essential (primary) hypertension: Secondary | ICD-10-CM | POA: Diagnosis not present

## 2020-05-01 DIAGNOSIS — I10 Essential (primary) hypertension: Secondary | ICD-10-CM | POA: Diagnosis not present

## 2020-05-01 DIAGNOSIS — E785 Hyperlipidemia, unspecified: Secondary | ICD-10-CM | POA: Diagnosis not present

## 2020-05-15 DIAGNOSIS — Z1211 Encounter for screening for malignant neoplasm of colon: Secondary | ICD-10-CM | POA: Diagnosis not present

## 2020-06-08 ENCOUNTER — Other Ambulatory Visit (HOSPITAL_COMMUNITY): Payer: Self-pay | Admitting: Family Medicine

## 2020-06-08 MED FILL — AMLODIPINE BESYLATE 5 MG TA: 5 | 90 days supply | Qty: 90 | Fill #0

## 2020-06-08 MED FILL — LOSARTAN POTASSIUM 100 MG T: 100 | 90 days supply | Qty: 90 | Fill #0

## 2020-09-15 MED FILL — LOSARTAN POTASSIUM 100 MG T: 100 | 30 days supply | Qty: 30 | Fill #1

## 2020-09-15 MED FILL — AMLODIPINE BESYLATE 5 MG TA: 5 | 30 days supply | Qty: 30 | Fill #1

## 2020-10-19 MED FILL — AMLODIPINE BESYLATE 5 MG TA: 5 | 30 days supply | Qty: 30 | Fill #2

## 2020-10-19 MED FILL — LOSARTAN POTASSIUM 100 MG T: 100 | 30 days supply | Qty: 30 | Fill #2

## 2020-11-17 ENCOUNTER — Other Ambulatory Visit (HOSPITAL_COMMUNITY): Payer: Self-pay

## 2020-11-17 MED ORDER — OMEPRAZOLE 20 MG PO CPDR
20.0000 mg | DELAYED_RELEASE_CAPSULE | Freq: Every day | ORAL | 0 refills | Status: DC
  Filled 2020-11-17: qty 30, 30d supply, fill #0

## 2020-11-17 MED ORDER — AMLODIPINE BESYLATE 5 MG PO TABS
ORAL_TABLET | ORAL | 1 refills | Status: DC
  Filled 2020-11-17 – 2020-12-23 (×2): qty 30, 30d supply, fill #0
  Filled 2021-01-24: qty 30, 30d supply, fill #1
  Filled 2021-02-26: qty 30, 30d supply, fill #2
  Filled 2021-04-01: qty 30, 30d supply, fill #3
  Filled 2021-05-04: qty 30, 30d supply, fill #4
  Filled 2021-06-04: qty 30, 30d supply, fill #5

## 2020-11-17 MED FILL — Losartan Potassium Tab 100 MG: ORAL | 30 days supply | Qty: 30 | Fill #0 | Status: AC

## 2020-11-17 MED FILL — Amlodipine Besylate Tab 5 MG (Base Equivalent): ORAL | 30 days supply | Qty: 30 | Fill #0 | Status: AC

## 2020-11-18 ENCOUNTER — Other Ambulatory Visit (HOSPITAL_COMMUNITY): Payer: Self-pay

## 2020-12-22 ENCOUNTER — Other Ambulatory Visit (HOSPITAL_COMMUNITY): Payer: Self-pay

## 2020-12-23 ENCOUNTER — Other Ambulatory Visit (HOSPITAL_COMMUNITY): Payer: Self-pay

## 2020-12-23 MED ORDER — LOSARTAN POTASSIUM 100 MG PO TABS
100.0000 mg | ORAL_TABLET | Freq: Every day | ORAL | 1 refills | Status: AC
  Filled 2020-12-23: qty 30, 30d supply, fill #0
  Filled 2021-01-24: qty 30, 30d supply, fill #1
  Filled 2021-02-26: qty 30, 30d supply, fill #2
  Filled 2021-04-01: qty 30, 30d supply, fill #3
  Filled 2021-05-04: qty 30, 30d supply, fill #4
  Filled 2021-06-04: qty 30, 30d supply, fill #5

## 2021-01-25 ENCOUNTER — Other Ambulatory Visit (HOSPITAL_COMMUNITY): Payer: Self-pay

## 2021-01-26 ENCOUNTER — Other Ambulatory Visit (HOSPITAL_COMMUNITY): Payer: Self-pay

## 2021-02-26 ENCOUNTER — Other Ambulatory Visit (HOSPITAL_COMMUNITY): Payer: Self-pay

## 2021-04-01 ENCOUNTER — Other Ambulatory Visit (HOSPITAL_COMMUNITY): Payer: Self-pay

## 2021-05-04 ENCOUNTER — Other Ambulatory Visit (HOSPITAL_COMMUNITY): Payer: Self-pay

## 2021-05-18 ENCOUNTER — Other Ambulatory Visit (HOSPITAL_COMMUNITY): Payer: Self-pay

## 2021-05-18 MED ORDER — LOSARTAN POTASSIUM 100 MG PO TABS
100.0000 mg | ORAL_TABLET | Freq: Every day | ORAL | 1 refills | Status: DC
  Filled 2021-05-18: qty 90, 90d supply, fill #0
  Filled 2021-07-02: qty 30, 30d supply, fill #0
  Filled 2021-08-03: qty 30, 30d supply, fill #1
  Filled 2021-09-10: qty 30, 30d supply, fill #2
  Filled 2021-10-15: qty 30, 30d supply, fill #3
  Filled 2021-11-14: qty 30, 30d supply, fill #4
  Filled 2021-12-14: qty 30, 30d supply, fill #5

## 2021-05-18 MED ORDER — MELOXICAM 7.5 MG PO TABS
7.5000 mg | ORAL_TABLET | Freq: Every day | ORAL | 0 refills | Status: DC | PRN
  Filled 2021-05-18: qty 30, 30d supply, fill #0

## 2021-05-18 MED ORDER — AMLODIPINE BESYLATE 5 MG PO TABS
5.0000 mg | ORAL_TABLET | Freq: Every day | ORAL | 1 refills | Status: DC
  Filled 2021-05-18 – 2021-07-02 (×2): qty 30, 30d supply, fill #0
  Filled 2021-08-03: qty 30, 30d supply, fill #1
  Filled 2021-09-10: qty 30, 30d supply, fill #2
  Filled 2021-10-15: qty 30, 30d supply, fill #3
  Filled 2021-11-14: qty 30, 30d supply, fill #4
  Filled 2021-12-14: qty 30, 30d supply, fill #5

## 2021-05-18 MED ORDER — OMEPRAZOLE 20 MG PO CPDR
20.0000 mg | DELAYED_RELEASE_CAPSULE | ORAL | 0 refills | Status: DC
  Filled 2021-05-18: qty 90, 180d supply, fill #0
  Filled 2021-06-04: qty 15, 30d supply, fill #0
  Filled 2021-08-13: qty 15, 30d supply, fill #1
  Filled 2021-09-10: qty 15, 30d supply, fill #2
  Filled 2021-10-15: qty 15, 30d supply, fill #3
  Filled 2021-11-11: qty 15, 30d supply, fill #4
  Filled 2021-12-10: qty 15, 30d supply, fill #5

## 2021-06-04 ENCOUNTER — Other Ambulatory Visit (HOSPITAL_COMMUNITY): Payer: Self-pay

## 2021-07-02 ENCOUNTER — Other Ambulatory Visit (HOSPITAL_COMMUNITY): Payer: Self-pay

## 2021-08-03 ENCOUNTER — Other Ambulatory Visit (HOSPITAL_COMMUNITY): Payer: Self-pay

## 2021-08-13 ENCOUNTER — Other Ambulatory Visit (HOSPITAL_COMMUNITY): Payer: Self-pay

## 2021-09-10 ENCOUNTER — Other Ambulatory Visit (HOSPITAL_COMMUNITY): Payer: Self-pay

## 2021-09-12 MED ORDER — MELOXICAM 7.5 MG PO TABS
7.5000 mg | ORAL_TABLET | Freq: Every day | ORAL | 0 refills | Status: AC | PRN
  Filled 2021-09-12: qty 30, 30d supply, fill #0

## 2021-09-13 ENCOUNTER — Other Ambulatory Visit (HOSPITAL_COMMUNITY): Payer: Self-pay

## 2021-10-15 ENCOUNTER — Other Ambulatory Visit (HOSPITAL_COMMUNITY): Payer: Self-pay

## 2021-11-11 ENCOUNTER — Other Ambulatory Visit (HOSPITAL_COMMUNITY): Payer: Self-pay

## 2021-11-15 ENCOUNTER — Other Ambulatory Visit (HOSPITAL_COMMUNITY): Payer: Self-pay

## 2021-12-10 ENCOUNTER — Other Ambulatory Visit (HOSPITAL_COMMUNITY): Payer: Self-pay

## 2021-12-14 ENCOUNTER — Other Ambulatory Visit (HOSPITAL_COMMUNITY): Payer: Self-pay

## 2021-12-24 ENCOUNTER — Other Ambulatory Visit (HOSPITAL_COMMUNITY): Payer: Self-pay

## 2022-01-17 ENCOUNTER — Other Ambulatory Visit (HOSPITAL_COMMUNITY): Payer: Self-pay

## 2022-01-17 MED ORDER — AMLODIPINE BESYLATE 5 MG PO TABS
5.0000 mg | ORAL_TABLET | Freq: Every day | ORAL | 1 refills | Status: DC
  Filled 2022-01-17: qty 30, 30d supply, fill #0
  Filled 2022-02-18: qty 30, 30d supply, fill #1
  Filled 2022-04-01: qty 30, 30d supply, fill #2
  Filled 2022-04-29: qty 30, 30d supply, fill #3
  Filled 2022-05-27: qty 30, 30d supply, fill #4
  Filled 2022-06-26: qty 30, 30d supply, fill #5

## 2022-01-17 MED ORDER — LOSARTAN POTASSIUM 100 MG PO TABS
100.0000 mg | ORAL_TABLET | Freq: Every day | ORAL | 1 refills | Status: DC
  Filled 2022-01-17: qty 30, 30d supply, fill #0
  Filled 2022-02-18: qty 30, 30d supply, fill #1
  Filled 2022-04-01: qty 30, 30d supply, fill #2
  Filled 2022-04-29: qty 30, 30d supply, fill #3
  Filled 2022-05-27: qty 30, 30d supply, fill #4
  Filled 2022-06-26: qty 30, 30d supply, fill #5

## 2022-01-18 ENCOUNTER — Other Ambulatory Visit (HOSPITAL_COMMUNITY): Payer: Self-pay

## 2022-01-18 MED ORDER — MELOXICAM 15 MG PO TABS
15.0000 mg | ORAL_TABLET | Freq: Every day | ORAL | 1 refills | Status: AC
  Filled 2022-01-18: qty 30, 30d supply, fill #0
  Filled 2022-02-18: qty 30, 30d supply, fill #1
  Filled 2022-04-01: qty 30, 30d supply, fill #2
  Filled 2022-04-29: qty 30, 30d supply, fill #3

## 2022-01-18 MED ORDER — OMEPRAZOLE 20 MG PO CPDR
20.0000 mg | DELAYED_RELEASE_CAPSULE | ORAL | 0 refills | Status: DC
  Filled 2022-01-18: qty 15, 30d supply, fill #0
  Filled 2022-02-18: qty 15, 30d supply, fill #1
  Filled 2022-04-01: qty 15, 30d supply, fill #2
  Filled 2022-04-29: qty 15, 30d supply, fill #3
  Filled 2022-05-27: qty 15, 30d supply, fill #4
  Filled 2022-06-26: qty 15, 30d supply, fill #5

## 2022-02-08 ENCOUNTER — Other Ambulatory Visit (HOSPITAL_COMMUNITY): Payer: Self-pay

## 2022-02-08 MED ORDER — ATORVASTATIN CALCIUM 20 MG PO TABS
20.0000 mg | ORAL_TABLET | Freq: Every day | ORAL | 0 refills | Status: DC
  Filled 2022-02-08: qty 30, 30d supply, fill #0
  Filled 2022-03-16: qty 30, 30d supply, fill #1
  Filled 2022-04-19: qty 30, 30d supply, fill #2

## 2022-02-18 ENCOUNTER — Other Ambulatory Visit (HOSPITAL_COMMUNITY): Payer: Self-pay

## 2022-03-16 ENCOUNTER — Other Ambulatory Visit (HOSPITAL_COMMUNITY): Payer: Self-pay

## 2022-04-01 ENCOUNTER — Other Ambulatory Visit (HOSPITAL_COMMUNITY): Payer: Self-pay

## 2022-04-19 ENCOUNTER — Other Ambulatory Visit (HOSPITAL_COMMUNITY): Payer: Self-pay

## 2022-04-29 ENCOUNTER — Other Ambulatory Visit (HOSPITAL_COMMUNITY): Payer: Self-pay

## 2022-05-18 ENCOUNTER — Other Ambulatory Visit (HOSPITAL_COMMUNITY): Payer: Self-pay

## 2022-05-26 ENCOUNTER — Other Ambulatory Visit (HOSPITAL_COMMUNITY): Payer: Self-pay

## 2022-05-26 MED ORDER — ATORVASTATIN CALCIUM 20 MG PO TABS
20.0000 mg | ORAL_TABLET | Freq: Every day | ORAL | 0 refills | Status: AC
  Filled 2022-05-26: qty 30, 30d supply, fill #0
  Filled 2022-06-26: qty 30, 30d supply, fill #1
  Filled 2022-07-25: qty 30, 30d supply, fill #2

## 2022-05-26 MED ORDER — MELOXICAM 15 MG PO TABS
15.0000 mg | ORAL_TABLET | Freq: Every day | ORAL | 1 refills | Status: DC | PRN
  Filled 2022-05-26: qty 30, 30d supply, fill #0
  Filled 2022-07-20: qty 30, 30d supply, fill #1
  Filled 2022-08-21: qty 30, 30d supply, fill #2
  Filled 2022-09-23: qty 30, 30d supply, fill #3

## 2022-05-27 ENCOUNTER — Other Ambulatory Visit (HOSPITAL_COMMUNITY): Payer: Self-pay

## 2022-06-20 ENCOUNTER — Other Ambulatory Visit (HOSPITAL_COMMUNITY): Payer: Self-pay

## 2022-06-27 ENCOUNTER — Other Ambulatory Visit (HOSPITAL_COMMUNITY): Payer: Self-pay

## 2022-07-20 ENCOUNTER — Other Ambulatory Visit (HOSPITAL_COMMUNITY): Payer: Self-pay

## 2022-07-26 ENCOUNTER — Other Ambulatory Visit (HOSPITAL_COMMUNITY): Payer: Self-pay

## 2022-07-26 MED ORDER — MELOXICAM 15 MG PO TABS
15.0000 mg | ORAL_TABLET | Freq: Every day | ORAL | 1 refills | Status: DC | PRN
  Filled 2022-07-26 – 2023-07-12 (×2): qty 30, 30d supply, fill #0

## 2022-07-26 MED ORDER — AMLODIPINE BESYLATE 5 MG PO TABS
5.0000 mg | ORAL_TABLET | Freq: Every day | ORAL | 1 refills | Status: DC
  Filled 2022-07-26: qty 30, 30d supply, fill #0
  Filled 2022-08-21: qty 30, 30d supply, fill #1
  Filled 2022-09-23: qty 30, 30d supply, fill #2
  Filled 2022-10-26: qty 30, 30d supply, fill #3
  Filled 2022-11-25: qty 30, 30d supply, fill #4
  Filled 2022-12-23: qty 30, 30d supply, fill #5

## 2022-07-26 MED ORDER — OMEPRAZOLE 20 MG PO CPDR
20.0000 mg | DELAYED_RELEASE_CAPSULE | ORAL | 1 refills | Status: AC
  Filled 2022-07-26: qty 15, 30d supply, fill #0
  Filled 2022-08-16: qty 15, 30d supply, fill #1
  Filled 2022-09-23: qty 15, 30d supply, fill #2
  Filled 2022-10-19: qty 15, 30d supply, fill #3
  Filled 2022-11-25: qty 15, 30d supply, fill #4
  Filled 2022-12-23: qty 15, 30d supply, fill #5
  Filled 2023-01-27 (×2): qty 15, 30d supply, fill #6

## 2022-07-26 MED ORDER — ATORVASTATIN CALCIUM 20 MG PO TABS
20.0000 mg | ORAL_TABLET | Freq: Every day | ORAL | 1 refills | Status: DC
  Filled 2022-07-26: qty 90, 90d supply, fill #0
  Filled 2022-08-22: qty 30, 30d supply, fill #0
  Filled 2022-09-23: qty 30, 30d supply, fill #1
  Filled 2022-10-26: qty 30, 30d supply, fill #2
  Filled 2022-11-25: qty 30, 30d supply, fill #3
  Filled 2022-12-23: qty 30, 30d supply, fill #4
  Filled 2023-01-27 (×2): qty 30, 30d supply, fill #5

## 2022-07-26 MED ORDER — LOSARTAN POTASSIUM 100 MG PO TABS
100.0000 mg | ORAL_TABLET | Freq: Every day | ORAL | 1 refills | Status: DC
  Filled 2022-07-26: qty 30, 30d supply, fill #0
  Filled 2022-08-21: qty 30, 30d supply, fill #1
  Filled 2022-09-23: qty 30, 30d supply, fill #2
  Filled 2022-10-26: qty 30, 30d supply, fill #3
  Filled 2022-11-25: qty 30, 30d supply, fill #4
  Filled 2022-12-23: qty 30, 30d supply, fill #5

## 2022-08-11 ENCOUNTER — Other Ambulatory Visit (HOSPITAL_COMMUNITY): Payer: Self-pay

## 2022-08-16 ENCOUNTER — Other Ambulatory Visit (HOSPITAL_COMMUNITY): Payer: Self-pay

## 2022-08-18 ENCOUNTER — Other Ambulatory Visit (HOSPITAL_COMMUNITY): Payer: Self-pay

## 2022-08-22 ENCOUNTER — Other Ambulatory Visit (HOSPITAL_COMMUNITY): Payer: Self-pay

## 2022-09-08 ENCOUNTER — Other Ambulatory Visit (HOSPITAL_COMMUNITY): Payer: Self-pay

## 2022-09-08 MED ORDER — BENZONATATE 100 MG PO CAPS
100.0000 mg | ORAL_CAPSULE | Freq: Three times a day (TID) | ORAL | 0 refills | Status: AC | PRN
  Filled 2022-09-08: qty 30, 10d supply, fill #0

## 2022-09-08 MED ORDER — OSELTAMIVIR PHOSPHATE 75 MG PO CAPS
75.0000 mg | ORAL_CAPSULE | Freq: Two times a day (BID) | ORAL | 0 refills | Status: AC
  Filled 2022-09-08: qty 10, 5d supply, fill #0

## 2022-09-15 ENCOUNTER — Other Ambulatory Visit (HOSPITAL_COMMUNITY): Payer: Self-pay

## 2022-09-15 MED ORDER — GUAIFENESIN-CODEINE 100-10 MG/5ML PO SOLN
10.0000 mL | ORAL | 0 refills | Status: AC | PRN
  Filled 2022-09-15: qty 120, 2d supply, fill #0

## 2022-10-19 ENCOUNTER — Other Ambulatory Visit (HOSPITAL_COMMUNITY): Payer: Self-pay

## 2022-10-24 ENCOUNTER — Other Ambulatory Visit (HOSPITAL_COMMUNITY): Payer: Self-pay

## 2022-10-25 ENCOUNTER — Other Ambulatory Visit (HOSPITAL_COMMUNITY): Payer: Self-pay

## 2022-10-25 MED ORDER — MELOXICAM 15 MG PO TABS
15.0000 mg | ORAL_TABLET | Freq: Every day | ORAL | 1 refills | Status: AC | PRN
  Filled 2022-10-25: qty 30, 30d supply, fill #0
  Filled 2022-11-21: qty 30, 30d supply, fill #1
  Filled 2022-12-23: qty 30, 30d supply, fill #2
  Filled 2023-01-27 (×2): qty 30, 30d supply, fill #3

## 2022-12-23 ENCOUNTER — Other Ambulatory Visit (HOSPITAL_COMMUNITY): Payer: Self-pay

## 2022-12-23 ENCOUNTER — Other Ambulatory Visit: Payer: Self-pay

## 2023-01-27 ENCOUNTER — Other Ambulatory Visit (HOSPITAL_COMMUNITY): Payer: Self-pay

## 2023-01-31 ENCOUNTER — Other Ambulatory Visit (HOSPITAL_COMMUNITY): Payer: Self-pay

## 2023-01-31 MED ORDER — MELOXICAM 15 MG PO TABS
15.0000 mg | ORAL_TABLET | Freq: Every day | ORAL | 1 refills | Status: AC | PRN
  Filled 2023-01-31 – 2023-03-08 (×2): qty 30, 30d supply, fill #0
  Filled 2023-04-06: qty 30, 30d supply, fill #1
  Filled 2023-05-15: qty 30, 30d supply, fill #2
  Filled 2023-06-13: qty 30, 30d supply, fill #3

## 2023-01-31 MED ORDER — ATORVASTATIN CALCIUM 20 MG PO TABS
20.0000 mg | ORAL_TABLET | Freq: Every evening | ORAL | 1 refills | Status: DC
  Filled 2023-01-31 – 2023-03-08 (×2): qty 30, 30d supply, fill #0
  Filled 2023-04-06: qty 30, 30d supply, fill #1
  Filled 2023-05-15: qty 30, 30d supply, fill #2
  Filled 2023-06-13: qty 30, 30d supply, fill #3
  Filled 2023-07-12: qty 30, 30d supply, fill #4
  Filled 2023-08-08: qty 30, 30d supply, fill #5

## 2023-01-31 MED ORDER — LOSARTAN POTASSIUM 100 MG PO TABS
100.0000 mg | ORAL_TABLET | Freq: Every day | ORAL | 1 refills | Status: DC
  Filled 2023-01-31 – 2023-02-15 (×2): qty 30, 30d supply, fill #0
  Filled 2023-03-21: qty 30, 30d supply, fill #1
  Filled 2023-04-19: qty 30, 30d supply, fill #2
  Filled 2023-05-15: qty 30, 30d supply, fill #3
  Filled 2023-06-13: qty 30, 30d supply, fill #4
  Filled 2023-07-12: qty 30, 30d supply, fill #5

## 2023-01-31 MED ORDER — AMLODIPINE BESYLATE 5 MG PO TABS
5.0000 mg | ORAL_TABLET | Freq: Every day | ORAL | 1 refills | Status: DC
  Filled 2023-01-31 – 2023-02-15 (×2): qty 30, 30d supply, fill #0
  Filled 2023-03-21: qty 30, 30d supply, fill #1
  Filled 2023-04-19: qty 30, 30d supply, fill #2
  Filled 2023-05-15: qty 30, 30d supply, fill #3
  Filled 2023-06-13: qty 30, 30d supply, fill #4
  Filled 2023-07-12: qty 30, 30d supply, fill #5

## 2023-01-31 MED ORDER — OMEPRAZOLE 20 MG PO CPDR
20.0000 mg | DELAYED_RELEASE_CAPSULE | ORAL | 1 refills | Status: AC
  Filled 2023-01-31: qty 15, 30d supply, fill #0

## 2023-02-13 ENCOUNTER — Other Ambulatory Visit (HOSPITAL_COMMUNITY): Payer: Self-pay

## 2023-02-15 ENCOUNTER — Other Ambulatory Visit (HOSPITAL_COMMUNITY): Payer: Self-pay

## 2023-02-22 ENCOUNTER — Other Ambulatory Visit (HOSPITAL_COMMUNITY): Payer: Self-pay

## 2023-02-22 MED ORDER — OMEPRAZOLE 20 MG PO CPDR
20.0000 mg | DELAYED_RELEASE_CAPSULE | Freq: Every day | ORAL | 1 refills | Status: AC
  Filled 2023-02-22 – 2023-03-03 (×2): qty 30, 30d supply, fill #0
  Filled 2023-04-06: qty 30, 30d supply, fill #1
  Filled 2023-05-05: qty 30, 30d supply, fill #2
  Filled 2023-06-13: qty 30, 30d supply, fill #3
  Filled 2023-07-12: qty 30, 30d supply, fill #4
  Filled 2023-08-08: qty 30, 30d supply, fill #5

## 2023-03-02 ENCOUNTER — Other Ambulatory Visit (HOSPITAL_COMMUNITY): Payer: Self-pay

## 2023-03-03 ENCOUNTER — Other Ambulatory Visit (HOSPITAL_COMMUNITY): Payer: Self-pay

## 2023-03-08 ENCOUNTER — Other Ambulatory Visit (HOSPITAL_COMMUNITY): Payer: Self-pay

## 2023-03-09 ENCOUNTER — Other Ambulatory Visit (HOSPITAL_COMMUNITY): Payer: Self-pay

## 2023-03-09 MED ORDER — NIRMATRELVIR&RITONAVIR 300/100 20 X 150 MG & 10 X 100MG PO TBPK
3.0000 | ORAL_TABLET | Freq: Two times a day (BID) | ORAL | 0 refills | Status: AC
  Filled 2023-03-09: qty 30, 5d supply, fill #0

## 2023-04-06 ENCOUNTER — Other Ambulatory Visit (HOSPITAL_COMMUNITY): Payer: Self-pay

## 2023-04-07 ENCOUNTER — Other Ambulatory Visit (HOSPITAL_COMMUNITY): Payer: Self-pay

## 2023-04-19 ENCOUNTER — Other Ambulatory Visit (HOSPITAL_COMMUNITY): Payer: Self-pay

## 2023-05-16 ENCOUNTER — Other Ambulatory Visit (HOSPITAL_COMMUNITY): Payer: Self-pay

## 2023-06-15 ENCOUNTER — Other Ambulatory Visit (HOSPITAL_COMMUNITY): Payer: Self-pay

## 2023-06-15 MED ORDER — INFLUENZA VIRUS VACC SPLIT PF (FLUZONE) 0.5 ML IM SUSY
0.5000 mL | PREFILLED_SYRINGE | Freq: Once | INTRAMUSCULAR | 0 refills | Status: AC
  Filled 2023-06-15: qty 0.5, 1d supply, fill #0

## 2023-07-12 ENCOUNTER — Other Ambulatory Visit: Payer: Self-pay

## 2023-07-27 ENCOUNTER — Other Ambulatory Visit (HOSPITAL_COMMUNITY): Payer: Self-pay

## 2023-07-27 MED ORDER — ATOVAQUONE-PROGUANIL HCL 250-100 MG PO TABS
1.0000 | ORAL_TABLET | Freq: Every day | ORAL | 0 refills | Status: AC
  Filled 2023-07-27: qty 20, 20d supply, fill #0

## 2023-07-27 MED ORDER — AZITHROMYCIN 500 MG PO TABS
ORAL_TABLET | ORAL | 0 refills | Status: AC
  Filled 2023-07-27: qty 4, 3d supply, fill #0

## 2023-07-31 ENCOUNTER — Other Ambulatory Visit (HOSPITAL_COMMUNITY): Payer: Self-pay

## 2023-08-08 ENCOUNTER — Other Ambulatory Visit (HOSPITAL_COMMUNITY): Payer: Self-pay

## 2023-08-10 ENCOUNTER — Other Ambulatory Visit (HOSPITAL_COMMUNITY): Payer: Self-pay

## 2023-08-10 MED ORDER — LOSARTAN POTASSIUM 100 MG PO TABS: 100.0000 mg | ORAL_TABLET | Freq: Every day | ORAL | 1 refills | Status: DC

## 2023-08-10 MED ORDER — LOSARTAN POTASSIUM 100 MG PO TABS
100.0000 mg | ORAL_TABLET | Freq: Every day | ORAL | 1 refills | Status: AC
  Filled 2023-08-10 – 2023-09-01 (×2): qty 30, 30d supply, fill #0
  Filled 2023-10-13: qty 30, 30d supply, fill #1
  Filled 2023-11-15: qty 30, 30d supply, fill #2
  Filled 2023-12-18: qty 30, 30d supply, fill #3
  Filled 2024-01-15: qty 30, 30d supply, fill #4
  Filled 2024-03-03: qty 30, 30d supply, fill #5

## 2023-08-10 MED ORDER — OMEPRAZOLE 20 MG PO CPDR
20.0000 mg | DELAYED_RELEASE_CAPSULE | Freq: Every day | ORAL | 1 refills | Status: AC
  Filled 2023-08-10: qty 90, 90d supply, fill #0

## 2023-08-10 MED ORDER — AMLODIPINE BESYLATE 5 MG PO TABS
5.0000 mg | ORAL_TABLET | Freq: Every day | ORAL | 1 refills | Status: AC
  Filled 2023-08-10 – 2023-09-01 (×2): qty 30, 30d supply, fill #0
  Filled 2023-10-13: qty 30, 30d supply, fill #1
  Filled 2023-11-15: qty 30, 30d supply, fill #2
  Filled 2023-12-18: qty 30, 30d supply, fill #3
  Filled 2024-01-15: qty 30, 30d supply, fill #4
  Filled 2024-03-03: qty 30, 30d supply, fill #5

## 2023-08-22 ENCOUNTER — Other Ambulatory Visit (HOSPITAL_COMMUNITY): Payer: Self-pay

## 2023-08-23 ENCOUNTER — Other Ambulatory Visit (HOSPITAL_COMMUNITY): Payer: Self-pay

## 2023-09-01 ENCOUNTER — Other Ambulatory Visit (HOSPITAL_COMMUNITY): Payer: Self-pay

## 2023-09-06 ENCOUNTER — Other Ambulatory Visit (HOSPITAL_COMMUNITY): Payer: Self-pay

## 2023-09-06 MED ORDER — MELOXICAM 15 MG PO TABS
15.0000 mg | ORAL_TABLET | Freq: Every day | ORAL | 1 refills | Status: DC | PRN
  Filled 2023-09-06: qty 30, 30d supply, fill #0
  Filled 2023-10-13: qty 30, 30d supply, fill #1
  Filled 2023-11-15: qty 30, 30d supply, fill #2
  Filled 2023-12-18: qty 30, 30d supply, fill #3

## 2023-09-07 ENCOUNTER — Other Ambulatory Visit (HOSPITAL_COMMUNITY): Payer: Self-pay

## 2023-09-11 ENCOUNTER — Other Ambulatory Visit (HOSPITAL_COMMUNITY): Payer: Self-pay

## 2023-09-12 ENCOUNTER — Other Ambulatory Visit (HOSPITAL_COMMUNITY): Payer: Self-pay

## 2023-09-12 MED ORDER — COVID-19 MRNA VAC-TRIS(PFIZER) 30 MCG/0.3ML IM SUSY
0.3000 mL | PREFILLED_SYRINGE | Freq: Once | INTRAMUSCULAR | 0 refills | Status: AC
  Filled 2023-09-12: qty 0.3, 1d supply, fill #0

## 2023-09-22 ENCOUNTER — Other Ambulatory Visit (HOSPITAL_COMMUNITY): Payer: Self-pay

## 2023-10-02 ENCOUNTER — Other Ambulatory Visit (HOSPITAL_COMMUNITY): Payer: Self-pay

## 2023-10-13 ENCOUNTER — Other Ambulatory Visit (HOSPITAL_COMMUNITY): Payer: Self-pay

## 2023-10-13 MED ORDER — ATORVASTATIN CALCIUM 20 MG PO TABS
20.0000 mg | ORAL_TABLET | Freq: Every evening | ORAL | 1 refills | Status: AC
  Filled 2023-10-13 (×2): qty 30, 30d supply, fill #0
  Filled 2023-11-15: qty 30, 30d supply, fill #1
  Filled 2023-12-18: qty 30, 30d supply, fill #2
  Filled 2024-01-15: qty 30, 30d supply, fill #3

## 2023-11-15 ENCOUNTER — Other Ambulatory Visit (HOSPITAL_COMMUNITY): Payer: Self-pay

## 2023-11-16 ENCOUNTER — Other Ambulatory Visit (HOSPITAL_COMMUNITY): Payer: Self-pay

## 2023-12-21 ENCOUNTER — Other Ambulatory Visit (HOSPITAL_COMMUNITY): Payer: Self-pay

## 2024-01-15 ENCOUNTER — Other Ambulatory Visit (HOSPITAL_COMMUNITY): Payer: Self-pay

## 2024-01-19 ENCOUNTER — Other Ambulatory Visit (HOSPITAL_COMMUNITY): Payer: Self-pay

## 2024-01-19 MED ORDER — MELOXICAM 15 MG PO TABS
15.0000 mg | ORAL_TABLET | Freq: Every day | ORAL | 1 refills | Status: DC | PRN
  Filled 2024-01-19: qty 30, 30d supply, fill #0
  Filled 2024-03-03: qty 30, 30d supply, fill #1
  Filled 2024-03-29 (×2): qty 30, 30d supply, fill #2
  Filled 2024-04-28: qty 30, 30d supply, fill #3

## 2024-01-30 ENCOUNTER — Other Ambulatory Visit (HOSPITAL_COMMUNITY): Payer: Self-pay

## 2024-01-30 MED ORDER — ATORVASTATIN CALCIUM 20 MG PO TABS
20.0000 mg | ORAL_TABLET | Freq: Every day | ORAL | 1 refills | Status: AC
  Filled 2024-03-29: qty 30, 30d supply, fill #0
  Filled 2024-04-28: qty 30, 30d supply, fill #1
  Filled 2024-05-29: qty 30, 30d supply, fill #2
  Filled 2024-06-27: qty 30, 30d supply, fill #3
  Filled 2024-07-30: qty 30, 30d supply, fill #4
  Filled 2024-09-06: qty 30, 30d supply, fill #5

## 2024-01-30 MED ORDER — LOSARTAN POTASSIUM 100 MG PO TABS
100.0000 mg | ORAL_TABLET | Freq: Every day | ORAL | 1 refills | Status: AC
  Filled 2024-03-29: qty 30, 30d supply, fill #0
  Filled 2024-04-28: qty 30, 30d supply, fill #1
  Filled 2024-05-29: qty 30, 30d supply, fill #2
  Filled 2024-06-27: qty 30, 30d supply, fill #3
  Filled 2024-07-30: qty 30, 30d supply, fill #4
  Filled 2024-09-06: qty 30, 30d supply, fill #5

## 2024-01-30 MED ORDER — AMLODIPINE BESYLATE 5 MG PO TABS
5.0000 mg | ORAL_TABLET | Freq: Every day | ORAL | 1 refills | Status: AC
  Filled 2024-03-29: qty 30, 30d supply, fill #0
  Filled 2024-04-28: qty 30, 30d supply, fill #1
  Filled 2024-05-29: qty 30, 30d supply, fill #2
  Filled 2024-06-27: qty 30, 30d supply, fill #3
  Filled 2024-07-30: qty 30, 30d supply, fill #4
  Filled 2024-09-06: qty 30, 30d supply, fill #5

## 2024-03-05 ENCOUNTER — Other Ambulatory Visit (HOSPITAL_COMMUNITY): Payer: Self-pay

## 2024-03-29 ENCOUNTER — Other Ambulatory Visit: Payer: Self-pay

## 2024-03-29 ENCOUNTER — Other Ambulatory Visit (HOSPITAL_COMMUNITY): Payer: Self-pay

## 2024-05-29 ENCOUNTER — Other Ambulatory Visit (HOSPITAL_COMMUNITY): Payer: Self-pay

## 2024-05-30 ENCOUNTER — Other Ambulatory Visit: Payer: Self-pay

## 2024-05-31 ENCOUNTER — Other Ambulatory Visit (HOSPITAL_COMMUNITY): Payer: Self-pay

## 2024-05-31 MED ORDER — MELOXICAM 15 MG PO TABS
15.0000 mg | ORAL_TABLET | Freq: Every day | ORAL | 1 refills | Status: AC | PRN
  Filled 2024-05-31: qty 30, 30d supply, fill #0
  Filled 2024-06-27: qty 30, 30d supply, fill #1
  Filled 2024-07-30: qty 30, 30d supply, fill #2
  Filled 2024-09-06: qty 30, 30d supply, fill #3

## 2024-05-31 MED ORDER — FLUZONE 0.5 ML IM SUSY
0.5000 mL | PREFILLED_SYRINGE | Freq: Once | INTRAMUSCULAR | 0 refills | Status: AC
  Filled 2024-05-31: qty 0.5, 1d supply, fill #0

## 2024-07-02 ENCOUNTER — Other Ambulatory Visit (HOSPITAL_COMMUNITY): Payer: Self-pay

## 2024-09-12 ENCOUNTER — Other Ambulatory Visit (HOSPITAL_COMMUNITY): Payer: Self-pay
